# Patient Record
Sex: Male | Born: 1970 | Race: White | Hispanic: No | Smoking: Current every day smoker
Health system: Southern US, Community
[De-identification: ages and names within clinical notes are randomized; demographics above are authoritative.]

## PROBLEM LIST (undated history)

## (undated) DIAGNOSIS — Q379 Unspecified cleft palate with unilateral cleft lip: Secondary | ICD-10-CM

## (undated) DIAGNOSIS — S8991XA Unspecified injury of right lower leg, initial encounter: Secondary | ICD-10-CM

## (undated) DIAGNOSIS — I10 Essential (primary) hypertension: Secondary | ICD-10-CM

## (undated) DIAGNOSIS — F319 Bipolar disorder, unspecified: Secondary | ICD-10-CM

## (undated) DIAGNOSIS — B2790 Infectious mononucleosis, unspecified without complication: Secondary | ICD-10-CM

## (undated) HISTORY — DX: Infectious mononucleosis, unspecified without complication: B27.90

## (undated) HISTORY — DX: Bipolar disorder, unspecified: F31.9

## (undated) HISTORY — DX: Unspecified cleft palate with unilateral cleft lip: Q37.9

## (undated) HISTORY — DX: Unspecified injury of right lower leg, initial encounter: S89.91XA

## (undated) HISTORY — DX: Essential (primary) hypertension: I10

## (undated) HISTORY — PX: CLEFT PALATE REPAIR: SUR1165

---

## 2000-12-19 ENCOUNTER — Emergency Department (HOSPITAL_COMMUNITY): Admission: EM | Admit: 2000-12-19 | Discharge: 2000-12-20 | Payer: Self-pay | Admitting: Emergency Medicine

## 2001-12-26 ENCOUNTER — Emergency Department (HOSPITAL_COMMUNITY): Admission: EM | Admit: 2001-12-26 | Discharge: 2001-12-26 | Payer: Self-pay | Admitting: Emergency Medicine

## 2004-06-09 ENCOUNTER — Emergency Department: Payer: Self-pay | Admitting: Emergency Medicine

## 2004-12-17 ENCOUNTER — Emergency Department: Payer: Self-pay | Admitting: Unknown Physician Specialty

## 2005-07-28 ENCOUNTER — Emergency Department: Payer: Self-pay | Admitting: Emergency Medicine

## 2006-12-15 ENCOUNTER — Other Ambulatory Visit: Payer: Self-pay

## 2006-12-15 ENCOUNTER — Emergency Department: Payer: Self-pay | Admitting: Emergency Medicine

## 2011-05-19 ENCOUNTER — Emergency Department: Payer: Self-pay | Admitting: Emergency Medicine

## 2014-05-14 ENCOUNTER — Ambulatory Visit
Admit: 2014-05-14 | Disposition: A | Discharge status: Home / Self Care | Payer: Self-pay | Attending: Family Medicine | Admitting: Family Medicine

## 2014-05-14 LAB — RAPID STREP-A WITH REFLX: Micro Text Report: NEGATIVE

## 2014-05-14 LAB — RAPID INFLUENZA A&B ANTIGENS (ARMC ONLY)

## 2014-05-19 LAB — BETA STREP CULTURE(ARMC)

## 2016-12-02 ENCOUNTER — Other Ambulatory Visit: Payer: Self-pay

## 2016-12-06 ENCOUNTER — Ambulatory Visit: Payer: Self-pay | Admitting: Family Medicine

## 2017-01-06 ENCOUNTER — Ambulatory Visit: Payer: Self-pay | Admitting: Family Medicine

## 2017-02-15 ENCOUNTER — Encounter: Payer: Self-pay | Admitting: Nurse Practitioner

## 2017-02-15 ENCOUNTER — Ambulatory Visit: Payer: Self-pay | Admitting: Nurse Practitioner

## 2017-02-15 ENCOUNTER — Other Ambulatory Visit: Payer: Self-pay

## 2017-02-15 VITALS — BP 156/101 | HR 89 | Temp 98.4°F | Ht 65.0 in | Wt 284.8 lb

## 2017-02-15 DIAGNOSIS — I10 Essential (primary) hypertension: Secondary | ICD-10-CM | POA: Insufficient documentation

## 2017-02-15 DIAGNOSIS — M7989 Other specified soft tissue disorders: Secondary | ICD-10-CM

## 2017-02-15 DIAGNOSIS — L03115 Cellulitis of right lower limb: Secondary | ICD-10-CM

## 2017-02-15 DIAGNOSIS — F17210 Nicotine dependence, cigarettes, uncomplicated: Secondary | ICD-10-CM

## 2017-02-15 DIAGNOSIS — E785 Hyperlipidemia, unspecified: Secondary | ICD-10-CM

## 2017-02-15 DIAGNOSIS — Z7689 Persons encountering health services in other specified circumstances: Secondary | ICD-10-CM

## 2017-02-15 MED ORDER — SULFAMETHOXAZOLE-TRIMETHOPRIM 800-160 MG PO TABS: 1.0000 | ORAL_TABLET | Freq: Two times a day (BID) | ORAL | 0 refills | Status: DC

## 2017-02-15 MED ORDER — HYDROCHLOROTHIAZIDE 12.5 MG PO TABS: 12.5000 mg | ORAL_TABLET | Freq: Every day | ORAL | 3 refills | Status: DC

## 2017-02-15 MED ORDER — LISINOPRIL 10 MG PO TABS: 10.0000 mg | ORAL_TABLET | Freq: Every day | ORAL | 0 refills | Status: DC

## 2017-02-15 NOTE — Progress Notes (Signed)
Subjective:    Patient ID: Scott Campos, male    DOB: [birth year], 47 y.o.   MRN: [id number]  Scott Campos is a 47 y.o. male presenting on 02/15/2017 for Establish Care (swelling, erythema Rt foot x 1 mth. History of planter fascia tear x . Pt reports constant standing on his feet.) and Hypertension   HPI Establish Care New Provider Pt last seen by PCP Dr. Loma Sender in [place] about 5 years ago.   R foot swelling Pt today reports no pain, but does have mild tenderness.   - [place] plantar fasciitis.  Used foot brace and wore this for several months.  Thought this was better, but has re-injured it about 1 month after improvement.  Wore brace for additional month and now has significant foot swelling without pain.  Has not continued followup at that office.  Discussed possibility of stents in foot. [place] area is where he received care. - Purchased compression copper fit sock at [employer]. - Took ibuprofen 1 pill qid and has continued. - Now feels only pressure from swelling, but not significanly bothersome/sore. - Denies claudication.  Has previously had negative workup of Vascular US at [facility], possible angiogram. No Care everywhere records available today.  Leg swelling is worse today than when at [facility].  Hypertension Took lisinopril 20 mg in past, but has not continued this in several years.  Admits he is a bit nervous in medical facilities.   - He is occasionally checking BP at home or outside of clinic.  Readings with DBP usually in 90s - Current medications: none, but previously had no side effects of medicine - He is not currently symptomatic. - Pt denies headache, lightheadedness, dizziness, changes in vision, chest tightness/pressure, palpitations, leg swelling, sudden loss of speech or loss of consciousness. - He  reports no regular exercise routine.  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 - His diet is high in salt, high in fat, and high  in carbohydrates.  Breakfast: coffee, sausage or butter biscuit (restaurant) or banana and little debbie snack cake, BLT Lunch: Pimiento cheese sandwich, turkey/cheese sandwich, peach cups or tangerines, Diet coke or Unsweet  (sweetnlow) Dinner: Meals out about 2x per week, Pasta frequently, vegetables occasionally (drains and rinses canned veg) Snacks: few, but is taco flavor doritos (handful) - Pt reports prior to last vascular studies, he was eating lots of fast food and fried foods.  Has made some changes already.  Was asked to start cholesterol medication, but didn't desire to start at that time.  Was also told to change diet, so he started that first.  Smoking since about age 63.  Current desire to quit smoking.  Has had about 2-3 years at 1.5 ppd.   Past Medical History:  Diagnosis Date  . Bipolar 1 disorder (HCC)   . Cleft palate and lip   . Malachi Carl infection   . Hypertension   . Right knee injury    Past Surgical History:  Procedure Laterality Date  . CLEFT PALATE REPAIR     Social History   Socioeconomic History  . Marital status: Divorced    Spouse name: Not on file  . Number of children: 0  . Years of education: Not on file  . Highest education level: Associate degree: occupational, Scientist, product/process development, or vocational program  Social Needs  . Financial resource strain: Not on file  . Food insecurity - worry: Not on file  . Food insecurity - inability: Not on  file  . Transportation needs - medical: Yes  . Transportation needs - non-medical: No  Occupational History  . Occupation: Personnel officerelectrician  Tobacco Use  . Smoking status: Current Every Day Smoker    Packs/day: 0.50    Years: 15.00    Pack years: 7.50    Types: Cigarettes  . Smokeless tobacco: Never Used  Substance and Sexual Activity  . Alcohol use: No    Frequency: Never  . Drug use: No  . Sexual activity: Not Currently  Other Topics Concern  . Not on file  Social History Narrative  . Not on file    Family History  Problem Relation Age of Onset  . Leukemia Mother   . Diabetes Father   . Hypertension Father    Current Outpatient Medications on File Prior to Visit  Medication Sig  . ibuprofen (ADVIL,MOTRIN) 200 MG tablet Take 200 mg by mouth every 6 (six) hours as needed.  . Multiple Vitamin (MULTI VITAMIN DAILY PO) Take by mouth.   No current facility-administered medications on file prior to visit.     Review of Systems  Constitutional: Negative.   HENT: Negative.   Eyes: Negative.   Respiratory: Negative.   Cardiovascular: Positive for leg swelling. Negative for chest pain and palpitations.  Gastrointestinal: Negative.   Endocrine: Negative.   Genitourinary: Negative.   Musculoskeletal: Positive for arthralgias (knees).  Skin: Positive for color change (red skin on R leg).  Allergic/Immunologic: Negative.   Neurological: Negative.   Hematological: Negative.   Psychiatric/Behavioral: Negative.    Per HPI unless specifically indicated above      Objective:    BP (!) 156/101 (BP Location: Right Arm, Patient Position: Sitting, Cuff Size: Large)   Pulse 89   Temp 98.4 F (36.9 C) (Oral)   Ht 5\' 5"  (1.651 m)   Wt 284 lb 12.8 oz (129.2 kg)   BMI 47.39 kg/m   Wt Readings from Last 3 Encounters:  02/15/17 284 lb 12.8 oz (129.2 kg)    Physical Exam  General - morbidly obese, well-appearing, NAD HEENT - Normocephalic, atraumatic Neck - supple, non-tender, no LAD, no thyromegaly, no carotid bruit Heart - RRR, no murmurs heard Lungs - Clear throughout all lobes, no wheezing, crackles, or rhonchi. Normal work of breathing. Extremeties - non-tender, +4 edema RLE/+3 edema LLE, cap refill < 2 seconds RLE/ =3 sec LLE, peripheral pulses not palpable/no doppler available Skin - warm, dry except feet. R foot warmer to touch than rest of his body, L foot cool to touch, no drainage/weeping, but are sweaty Neuro - awake, alert, oriented x3, normal gait Psych - Normal mood  and affect, normal behavior      Assessment & Plan:   Problem List Items Addressed This Visit      Cardiovascular and Mediastinum   Essential hypertension - Primary    Unontrolled hypertension.  BP at goal.  Pt is working on lifestyle modifications.  He is not currently taking medications, but previously took them over 5 years ago and tolerated lisinopril well without side effects. Vascular complications likely.  Plan: 1. START taking lisinopril 10 mg once daily.  START hydrochlorothiazide 12.5 mg once daily.  Choice to start two agents r/t fluid retention. 2. Obtain labs BMP, Lipid today.  CMP at next appointment. 3. Encouraged heart healthy diet and increasing exercise to 30 minutes most days of the week. 4. Check BP 1-2 x per week at home, keep log, and bring to clinic at next appointment. 5. Follow  up 4 weeks.       Relevant Medications   lisinopril (PRINIVIL,ZESTRIL) 10 MG tablet   hydrochlorothiazide (HYDRODIURIL) 12.5 MG tablet   Other Relevant Orders   BASIC METABOLIC PANEL WITH GFR    Other Visit Diagnoses    Swelling of lower leg     Bilateral lower extremity edema is new symptoms since last vascular studies.  Likely venous stasis/reflux.  Without records from [facility], unable to tell if venous studies were performed.  Pt description is indicative of arterial studies with angiogram.  Will obtain records.  Cap refill sufficient RLE, decreased slightly on LLE.  No other evidence of arterial insufficiency.  No skin breakdown.  Plan: 1. Start HCTZ 12.5 mg for BP and edema management. 2. Encouraged weight loss for improvement of symptoms. 3. Increase physical activity to 30 minutes most days of the week. 4. Start wearing compression stockings OTC from [facility] as previously purchased.  Can increase pressure if tolerated and effective w/o arterial insufficiency. 5. Followup 4 weeks.   Relevant Medications   hydrochlorothiazide (HYDRODIURIL) 12.5 MG tablet   Other Relevant Orders    VAS [place] UPPER EXTREMITY VENOUS DUPLEX   VAS [place] ABI WITH/WO TBI   Cellulitis of right lower extremity     Erythematous, warm skin of R foot c/w cellulitis outside of foot pain or known injury.  Complicated by obesity, probable venous stasis.  No current weeping noted.  Plan: 1. START Bactrim 800-160mg  bid x 14 days. 2. Encouraged pt to wear compression socks. 3. Followup 2-4 weeks prn.   Relevant Medications   sulfamethoxazole-trimethoprim (BACTRIM DS,SEPTRA DS) 800-160 MG tablet   Encounter to establish care Previous PCP was > 5 years ago.  Recent care by specialists at [facility].  Records will be requested.  Past medical, family, and surgical history reviewed w/ pt.  Smoking Cessation Pt reports has cut back smoking from 1.5 ppd last year after his father's death to 0.5 ppd now.  He would like to completely quit.  Is still able to cut back, so encouraged to work on further reducing cigarettes.  Offered future pharmacotherapy if unsuccessful independently.   Discussion today < 5 minutes specifically on counseling on risks of tobacco use, complications, treatment, smoking cessation.        Hyperlipidemia, unspecified hyperlipidemia type     Per pt report, was elevated at last check at [facility]. Pt has made lifestyle changes, so will recheck lipid panel and reassess need for statin therapy.   Relevant Medications   lisinopril (PRINIVIL,ZESTRIL) 10 MG tablet   hydrochlorothiazide (HYDRODIURIL) 12.5 MG tablet   Other Relevant Orders   Lipid panel      Meds ordered this encounter  Medications  . lisinopril (PRINIVIL,ZESTRIL) 10 MG tablet    Sig: Take 1 tablet (10 mg total) by mouth daily.    Dispense:  30 tablet    Refill:  0    Order Specific Question:   Supervising Provider    Answer:   Smitty Cords [[id number]]  . hydrochlorothiazide (HYDRODIURIL) 12.5 MG tablet    Sig: Take 1 tablet (12.5 mg total) by mouth daily.    Dispense:  90 tablet    Refill:  3    Order Specific Question:    Supervising Provider    Answer:   Smitty Cords [[id number]]  . sulfamethoxazole-trimethoprim (BACTRIM DS,SEPTRA DS) 800-160 MG tablet    Sig: Take 1 tablet by mouth 2 (two) times daily.    Dispense:  28 tablet  Refill:  0    Order Specific Question:   Supervising Provider    Answer:   Smitty Cords [[id number]]    Follow up plan: Return in about 4 weeks (around 03/15/2017) for Hypertension.  [name], DNP, AGPCNP-BC Adult Gerontology Primary Care Nurse Practitioner [facility] 02/15/2017, 11:05 AM

## 2017-02-15 NOTE — Assessment & Plan Note (Signed)
Unontrolled hypertension.  BP at goal.  Pt is working on lifestyle modifications.  He is not currently taking medications, but previously took them over 5 years ago and tolerated lisinopril well without side effects. Vascular complications likely.  Plan: 1. START taking lisinopril 10 mg once daily.  START hydrochlorothiazide 12.5 mg once daily.  Choice to start two agents r/t fluid retention. 2. Obtain labs BMP, Lipid today.  CMP at next appointment. 3. Encouraged heart healthy diet and increasing exercise to 30 minutes most days of the week. 4. Check BP 1-2 x per week at home, keep log, and bring to clinic at next appointment. 5. Follow up 4 weeks.

## 2017-02-15 NOTE — Patient Instructions (Addendum)
Scott Campos, Thank you for coming in to clinic today.  1. Restart BP medications.  - Take hydrochlorothiazide 12.5 mg once daily and lisinopril 10 mg once daily  2. We are getting a repeat ultrasound of your legs.  They will call to schedule.   Please schedule a follow-up appointment with Scott McardleLauren Cas Campos, AGNP. Return in about 4 weeks (around 03/15/2017) for Hypertension.  If you have any other questions or concerns, please feel free to call the clinic or send a message through MyChart. You may also schedule an earlier appointment if necessary.  You will receive a survey after today's visit either digitally by e-mail or paper by [employer] mail. Your experiences and feedback matter to us.  Please respond so we know how we are doing as we provide care for you.   Scott McardleLauren Kelton Bultman, DNP, AGNP-BC Adult Gerontology Nurse Practitioner [facility], [facility]    Managing Your Hypertension Hypertension is commonly called high blood pressure. This is when the force of your blood pressing against the walls of your arteries is too strong. Arteries are blood vessels that carry blood from your heart throughout your body. Hypertension forces the heart to work harder to pump blood, and may cause the arteries to become narrow or stiff. Having untreated or uncontrolled hypertension can cause heart attack, stroke, kidney disease, and other problems. What are blood pressure readings? A blood pressure reading consists of a higher number over a lower number. Ideally, your blood pressure should be below 120/80. The first ("top") number is called the systolic pressure. It is a measure of the pressure in your arteries as your heart beats. The second ("bottom") number is called the diastolic pressure. It is a measure of the pressure in your arteries as the heart relaxes. What does my blood pressure reading mean? Blood pressure is classified into four stages. Based on your blood pressure reading, your health care provider  may use the following stages to determine what type of treatment you need, if any. Systolic pressure and diastolic pressure are measured in a unit called mm Hg. Normal  Systolic pressure: below 120.  Diastolic pressure: below 80. Elevated  Systolic pressure: 120-129.  Diastolic pressure: below 80. Hypertension stage 1  Systolic pressure: 130-139.  Diastolic pressure: 80-89. Hypertension stage 2  Systolic pressure: 140 or above.  Diastolic pressure: 90 or above. What health risks are associated with hypertension? Managing your hypertension is an important responsibility. Uncontrolled hypertension can lead to:  A heart attack.  A stroke.  A weakened blood vessel (aneurysm).  Heart failure.  Kidney damage.  Eye damage.  Metabolic syndrome.  Memory and concentration problems.  What changes can I make to manage my hypertension? Hypertension can be managed by making lifestyle changes and possibly by taking medicines. Your health care provider will help you make a plan to bring your blood pressure within a normal range. Eating and drinking  Eat a diet that is high in fiber and potassium, and low in salt (sodium), added sugar, and fat. An example eating plan is called the DASH (Dietary Approaches to Stop Hypertension) diet. To eat this way: ? Eat plenty of fresh fruits and vegetables. Try to fill half of your plate at each meal with fruits and vegetables. ? Eat whole grains, such as whole wheat pasta, brown rice, or whole grain bread. Fill about one quarter of your plate with whole grains. ? Eat low-fat diary products. ? Avoid fatty cuts of meat, processed or cured meats, and poultry with  skin. Fill about one quarter of your plate with lean proteins such as fish, chicken without skin, beans, eggs, and tofu. ? Avoid premade and processed foods. These tend to be higher in sodium, added sugar, and fat.  Reduce your daily sodium intake. Most people with hypertension should eat  less than 1,500 mg of sodium a day.  Limit alcohol intake to no more than 1 drink a day for nonpregnant women and 2 drinks a day for men. One drink equals 12 oz of beer, 5 oz of wine, or 1 oz of hard liquor. Lifestyle  Work with your health care provider to maintain a healthy body weight, or to lose weight. Ask what an ideal weight is for you.  Get at least 30 minutes of exercise that causes your heart to beat faster (aerobic exercise) most days of the week. Activities may include walking, swimming, or biking.  Include exercise to strengthen your muscles (resistance exercise), such as weight lifting, as part of your weekly exercise routine. Try to do these types of exercises for 30 minutes at least 3 days a week.  Do not use any products that contain nicotine or tobacco, such as cigarettes and e-cigarettes. If you need help quitting, ask your health care provider.  Control any long-term (chronic) conditions you have, such as high cholesterol or diabetes. Monitoring  Monitor your blood pressure at home as told by your health care provider. Your personal target blood pressure may vary depending on your medical conditions, your age, and other factors.  Have your blood pressure checked regularly, as often as told by your health care provider. Working with your health care provider  Review all the medicines you take with your health care provider because there may be side effects or interactions.  Talk with your health care provider about your diet, exercise habits, and other lifestyle factors that may be contributing to hypertension.  Visit your health care provider regularly. Your health care provider can help you create and adjust your plan for managing hypertension. Will I need medicine to control my blood pressure? Your health care provider may prescribe medicine if lifestyle changes are not enough to get your blood pressure under control, and if:  Your systolic blood pressure is 130 or  higher.  Your diastolic blood pressure is 80 or higher.  Take medicines only as told by your health care provider. Follow the directions carefully. Blood pressure medicines must be taken as prescribed. The medicine does not work as well when you skip doses. Skipping doses also puts you at risk for problems. Contact a health care provider if:  You think you are having a reaction to medicines you have taken.  You have repeated (recurrent) headaches.  You feel dizzy.  You have swelling in your ankles.  You have trouble with your vision. Get help right away if:  You develop a severe headache or confusion.  You have unusual weakness or numbness, or you feel faint.  You have severe pain in your chest or abdomen.  You vomit repeatedly.  You have trouble breathing. Summary  Hypertension is when the force of blood pumping through your arteries is too strong. If this condition is not controlled, it may put you at risk for serious complications.  Your personal target blood pressure may vary depending on your medical conditions, your age, and other factors. For most people, a normal blood pressure is less than 120/80.  Hypertension is managed by lifestyle changes, medicines, or both. Lifestyle changes include weight  loss, eating a healthy, low-sodium diet, exercising more, and limiting alcohol. This information is not intended to replace advice given to you by your health care provider. Make sure you discuss any questions you have with your health care provider. Document Released: 09/29/2011 Document Revised: 12/03/2015 Document Reviewed: 12/03/2015 Elsevier Interactive Patient Education  [employer].

## 2017-03-18 ENCOUNTER — Ambulatory Visit: Payer: Self-pay | Admitting: Nurse Practitioner

## 2017-03-18 ENCOUNTER — Encounter: Payer: Self-pay | Admitting: Nurse Practitioner

## 2017-03-18 ENCOUNTER — Other Ambulatory Visit: Payer: Self-pay

## 2017-03-18 DIAGNOSIS — I10 Essential (primary) hypertension: Secondary | ICD-10-CM

## 2017-03-18 MED ORDER — LISINOPRIL 20 MG PO TABS
20.0000 mg | ORAL_TABLET | Freq: Every day | ORAL | 5 refills | Status: DC
Start: 2017-03-18 — End: 2017-10-09

## 2017-03-18 NOTE — Progress Notes (Signed)
Subjective:    Patient ID: Scott Campos, male    DOB: Jan 14, 1971, 47 y.o.   MRN: [id number]  Scott Campos is a 47 y.o. male presenting on 03/18/2017 for Hypertension   HPI Hypertension - He is not checking BP at home or outside of clinic.    - Current medications: hydrochlorothiazide, tolerating well without side effects - He is not currently symptomatic. - Pt denies headache, lightheadedness, dizziness, changes in vision, chest tightness/pressure, palpitations, leg swelling, sudden loss of speech or loss of consciousness. - He  reports an exercise routine that includes building projects and activities at home,   days per week. - His diet is moderate in salt, moderate in fat, and moderate in carbohydrates.   Increasing vegetables (broccoli, corn, blackeye peas, green beans)  Has had significant improvement of leg swelling with compression socks.  Social History   Tobacco Use  . Smoking status: Current Every Day Smoker    Packs/day: 0.50    Years: 15.00    Pack years: 7.50    Types: Cigarettes  . Smokeless tobacco: Never Used  Substance Use Topics  . Alcohol use: No    Frequency: Never  . Drug use: No    Review of Systems Per HPI unless specifically indicated above     Objective:    BP (!) 134/96 (BP Location: Right Arm, Patient Position: Sitting, Cuff Size: Large)   Pulse (!) 102   Temp 98.3 F (36.8 C) (Oral)   Ht 5\' 5"  (1.651 m)   Wt 272 lb 6.4 oz (123.6 kg)   BMI 45.33 kg/m   Wt Readings from Last 3 Encounters:  04/05/17 272 lb 9.6 oz (123.7 kg)  03/18/17 272 lb 6.4 oz (123.6 kg)  02/15/17 284 lb 12.8 oz (129.2 kg)    Physical Exam  Constitutional: He is oriented to person, place, and time. He appears well-developed and well-nourished. No distress.  Neck: Normal range of motion. Neck supple. Carotid bruit is not present.  Cardiovascular: Normal rate, regular rhythm, S1 normal, S2 normal, normal heart sounds and intact distal pulses.    Pulmonary/Chest: Effort normal and breath sounds normal. No respiratory distress.  Musculoskeletal: He exhibits no edema (pedal).  Neurological: He is alert and oriented to person, place, and time.  Skin: Skin is warm and dry.  Psychiatric: He has a normal mood and affect. His behavior is normal.  Vitals reviewed.    Results for orders placed or performed in visit on 02/15/17  Lipid panel  Result Value Ref Range   Cholesterol 166 <200 mg/dL   HDL 45 >78>40 mg/dL   Triglycerides 295135 <621<150 mg/dL   LDL Cholesterol (Calc) 98 mg/dL (calc)   Total CHOL/HDL Ratio 3.7 <5.0 (calc)   Non-HDL Cholesterol (Calc) 121 <130 mg/dL (calc)  BASIC METABOLIC PANEL WITH GFR  Result Value Ref Range   Glucose, Bld 115 (H) 65 - 99 mg/dL   BUN 6 (L) 7 - 25 mg/dL   Creat 3.080.88 6.570.60 - 8.461.35 mg/dL   GFR, Est Non African American 102 > OR = 60 mL/min/1.7573m2   GFR, Est African American 118 > OR = 60 mL/min/1.3273m2   BUN/Creatinine Ratio 7 6 - 22 (calc)   Sodium 135 135 - 146 mmol/L   Potassium 4.1 3.5 - 5.3 mmol/L   Chloride 96 (L) 98 - 110 mmol/L   CO2 30 20 - 32 mmol/L   Calcium 10.3 8.6 - 10.3 mg/dL      Assessment & Plan:  Problem List Items Addressed This Visit      Cardiovascular and Mediastinum   Essential hypertension   Relevant Medications   lisinopril (PRINIVIL,ZESTRIL) 20 MG tablet    Persistently uncontrolled hypertension, but is slightly improved.  BP goal < 130/80.  Pt is working on lifestyle modifications.  Taking medications tolerating well without side effects. No currently identified complications.  Plan: 1. Continue taking hydrochlorothiazide.  Increase lisinopril to 20 mg once daily.  Can consider increase of HCTZ to 25 in future if needed. 2. Encouraged heart healthy diet and increasing exercise to 30 minutes most days of the week. 3. Check BP 1-2 x per week at home, keep log, and bring to clinic at next appointment. 4. Follow up 3 months.     Meds ordered this encounter   Medications  . lisinopril (PRINIVIL,ZESTRIL) 20 MG tablet    Sig: Take 1 tablet (20 mg total) by mouth daily.    Dispense:  30 tablet    Refill:  5    Order Specific Question:   Supervising Provider    Answer:   Smitty Cords [[id number]]    Follow up plan: Return in about 3 months (around 06/18/2017) for hypertension.   [name], DNP, AGPCNP-BC Adult Gerontology Primary Care Nurse Practitioner [facility] 04/12/2017, 4:44 PM

## 2017-03-18 NOTE — Patient Instructions (Addendum)
Scott Campos, Thank you for coming in to clinic today.  1. [facility] [address]  [place], [place] [address]  [phone]  [facility] [address] [place], [place] [address] [phone]  2. Increase lisinopril to 20 mg once daily  3. Continue hydrochlorothiazide 12.5 mg once daily.  Please schedule a follow-up appointment with Scott Campos, Scott Campos. Return in about 3 months (around 06/18/2017) for hypertension.  If you have any other questions or concerns, please feel free to call the clinic or send a message through MyChart. You may also schedule an earlier appointment if necessary.  You will receive a survey after today's visit either digitally by e-mail or paper by [employer] mail. Your experiences and feedback matter to us.  Please respond so we know how we are doing as we provide care for you.   Scott McardleLauren Anna Beaird, Scott Campos, Scott Campos Adult Gerontology Nurse Practitioner [facility] [place] [facility], [facility]

## 2017-03-19 LAB — BASIC METABOLIC PANEL WITH GFR
BUN/Creatinine Ratio: 7 (calc) (ref 6–22)
BUN: 6 mg/dL — ABNORMAL LOW (ref 7–25)
CO2: 30 mmol/L (ref 20–32)
Calcium: 10.3 mg/dL (ref 8.6–10.3)
Chloride: 96 mmol/L — ABNORMAL LOW (ref 98–110)
Creat: 0.88 mg/dL (ref 0.60–1.35)
GFR, Est African American: 118 mL/min/{1.73_m2} (ref >=60)
GFR, Est Non African American: 102 mL/min/{1.73_m2} (ref >=60)
Glucose, Bld: 115 mg/dL — ABNORMAL HIGH (ref 65–99)
Potassium: 4.1 mmol/L (ref 3.5–5.3)
Sodium: 135 mmol/L (ref 135–146)

## 2017-03-19 LAB — LIPID PANEL
Cholesterol: 166 mg/dL (ref <200)
HDL: 45 mg/dL (ref >40)
LDL Cholesterol (Calc): 98 mg/dL (calc)
Non-HDL Cholesterol (Calc): 121 mg/dL (calc) (ref <130)
Total CHOL/HDL Ratio: 3.7 (calc) (ref <5.0)
Triglycerides: 135 mg/dL (ref <150)

## 2017-03-21 ENCOUNTER — Telehealth: Payer: Self-pay

## 2017-03-21 NOTE — Telephone Encounter (Signed)
Attempted to contacted the patient, no answer.

## 2017-03-21 NOTE — Telephone Encounter (Signed)
-----   Message from Lauren Renee Kennedy, NP sent at 03/21/2017  8:13 AM EST ----- Lipid panel: normal values.  No changes necessary.  Continue working toward eating a healthy diet and increasing exercise.  Ideally, your HDL or good cholesterol should be higher than 50.  BMP: normal kidney function, liver function, and electrolytes.  Glucose is slightly elevated for fasting glucose.  I would recommend we check a hemoglobin A1c next time you are in clinic.  This will help us know if you are at risk of developing diabetes or if you have diabetes.  A single elevation in fasting glucose can occur without these conditions, so that test will be helpful for confirming. 

## 2017-03-21 NOTE — Telephone Encounter (Signed)
Attempted to contact the pt, the home phone number busy and cell phone unable to take incoming call . Will try again.

## 2017-03-22 NOTE — Telephone Encounter (Signed)
Attempted to contact the pt, no answer. LMOm to return my call.  

## 2017-03-22 NOTE — Telephone Encounter (Signed)
-----   Message from Lauren Renee Kennedy, NP sent at 03/21/2017  8:13 AM EST ----- Lipid panel: normal values.  No changes necessary.  Continue working toward eating a healthy diet and increasing exercise.  Ideally, your HDL or good cholesterol should be higher than 50.  BMP: normal kidney function, liver function, and electrolytes.  Glucose is slightly elevated for fasting glucose.  I would recommend we check a hemoglobin A1c next time you are in clinic.  This will help us know if you are at risk of developing diabetes or if you have diabetes.  A single elevation in fasting glucose can occur without these conditions, so that test will be helpful for confirming. 

## 2017-03-22 NOTE — Telephone Encounter (Signed)
The pt was notified of his lab results, no questions or concerns.

## 2017-03-22 NOTE — Telephone Encounter (Signed)
-----   Message from Galen ManilaLauren Renee Kennedy, NP sent at 03/21/2017  8:13 AM EST ----- Lipid panel: normal values.  No changes necessary.  Continue working toward eating a healthy diet and increasing exercise.  Ideally, your HDL or good cholesterol should be higher than 50.  BMP: normal kidney function, liver function, and electrolytes.  Glucose is slightly elevated for fasting glucose.  I would recommend we check a hemoglobin A1c next time you are in clinic.  This will help us know if you are at risk of developing diabetes or if you have diabetes.  A single elevation in fasting glucose can occur without these conditions, so that test will be helpful for confirming.

## 2017-04-04 ENCOUNTER — Telehealth: Payer: Self-pay | Admitting: Nurse Practitioner

## 2017-04-04 ENCOUNTER — Ambulatory Visit: Payer: Self-pay | Admitting: Nurse Practitioner

## 2017-04-04 NOTE — Telephone Encounter (Signed)
Pt has cough, headache and fever

## 2017-04-05 ENCOUNTER — Ambulatory Visit
Admission: RE | Admit: 2017-04-05 | Discharge: 2017-04-05 | Disposition: A | Discharge status: Home / Self Care | Payer: Self-pay | Source: Ambulatory Visit | Attending: Nurse Practitioner | Admitting: Nurse Practitioner

## 2017-04-05 ENCOUNTER — Ambulatory Visit: Payer: Self-pay | Admitting: Nurse Practitioner

## 2017-04-05 ENCOUNTER — Encounter: Payer: Self-pay | Admitting: Nurse Practitioner

## 2017-04-05 ENCOUNTER — Other Ambulatory Visit: Payer: Self-pay

## 2017-04-05 VITALS — BP 109/83 | HR 80 | Temp 98.0°F | Resp 20 | Ht 65.0 in | Wt 272.6 lb

## 2017-04-05 DIAGNOSIS — R509 Fever, unspecified: Secondary | ICD-10-CM | POA: Insufficient documentation

## 2017-04-05 DIAGNOSIS — J22 Unspecified acute lower respiratory infection: Secondary | ICD-10-CM | POA: Insufficient documentation

## 2017-04-05 DIAGNOSIS — R05 Cough: Secondary | ICD-10-CM | POA: Insufficient documentation

## 2017-04-05 DIAGNOSIS — R6889 Other general symptoms and signs: Secondary | ICD-10-CM

## 2017-04-05 DIAGNOSIS — R059 Cough, unspecified: Secondary | ICD-10-CM

## 2017-04-05 DIAGNOSIS — J3489 Other specified disorders of nose and nasal sinuses: Secondary | ICD-10-CM

## 2017-04-05 DIAGNOSIS — R062 Wheezing: Secondary | ICD-10-CM | POA: Insufficient documentation

## 2017-04-05 LAB — POCT INFLUENZA A/B
Influenza A, POC: NEGATIVE
Influenza B, POC: NEGATIVE

## 2017-04-05 MED ORDER — IPRATROPIUM-ALBUTEROL 0.5-2.5 (3) MG/3ML IN SOLN: 3.0000 mL | Freq: Four times a day (QID) | RESPIRATORY_TRACT | Status: AC

## 2017-04-05 MED ORDER — BENZONATATE 100 MG PO CAPS: 100.0000 mg | ORAL_CAPSULE | Freq: Three times a day (TID) | ORAL | 0 refills | Status: DC | PRN

## 2017-04-05 MED ORDER — ALBUTEROL SULFATE HFA 108 (90 BASE) MCG/ACT IN AERS: 1.0000 | INHALATION_SPRAY | Freq: Four times a day (QID) | RESPIRATORY_TRACT | 5 refills | Status: DC | PRN

## 2017-04-05 MED ORDER — IPRATROPIUM BROMIDE 0.06 % NA SOLN: 2.0000 | Freq: Four times a day (QID) | NASAL | 0 refills | Status: DC

## 2017-04-05 NOTE — Patient Instructions (Addendum)
Scott Campos, Thank you for coming in to clinic today.  1.  Your flu test was inclusive - it did not complete correctly.  It is possible you can still have the flu, otherwise it could be a different virus causing your symptoms.  - Wash hands and cover cough very well to avoid spread of infection - For symptom control:      - Take Ibuprofen / Advil 400-600mg  every 6-8 hours as needed for fever / muscle aches.  You may also take Tylenol 500-1000mg  per dose every 6-8 hours or 3 times a day.  You can alternate dosing and take both in the same day.      - Do not take more than 3,000 mg acetaminophen (Tylenol) in a day.      - Start Tessalon perles one every 8 hours or 3 times a day as needed for cough.      - Start Atrovent nasal spray decongestant 2 sprays in each nostril up to 4 times daily for 7 days.      - Start OTC Mucinex for congestion/ Mucinex-DM for cough and congestion for up to 7 days. - Improve hydration with plenty of clear fluids.  Drink up to 8 glasses of water / fluids each day.  - You also have wheezing from bronchitis or pneumonia.  We can confirm once your Xray results have returned.  START albuterol 1-2 puffs every 6 hours as needed for wheezing and shortness of breath.  I will let you know if you will need antibiotics.  If significant worsening with poor fluid intake, worsening fever, difficulty breathing due to coughing, worsening body aches, weakness, or other more concerning symptoms difficulty breathing you can seek treatment at Emergency Department. If your flu symptoms have improved and then get worse several days to a week later with concerns for bronchitis, productive cough, fever, and chills we may need to check for possible pneumonia that can occur after the flu.  Please schedule a follow-up appointment with Scott Campos, AGNP. Return 7-10 days if symptoms worsen or fail to improve.  If you have any other questions or concerns, please feel free to call the clinic or send  a message through MyChart. You may also schedule an earlier appointment if necessary.  You will receive a survey after today's visit either digitally by e-mail or paper by [employer] mail. Your experiences and feedback matter to us.  Please respond so we know how we are doing as we provide care for you.   Scott McardleLauren Genese Quebedeaux, DNP, AGNP-BC Adult Gerontology Nurse Practitioner [facility] [place] [facility], [facility]

## 2017-04-05 NOTE — Progress Notes (Signed)
Subjective:    Patient ID: Scott Campos, male    DOB: April 17, 1970, 47 y.o.   MRN: [id number]  Scott SaaChristopher R Vivona is a 47 y.o. male presenting on 04/05/2017 for Cough (vomiting x 24hrs on Friday, coughing, bodyaches, chest congestion, wheezing and chills x 4 days )   HPI Flu-like symptoms Pt presents today 4 days after initial symptoms.  Symptoms include bodyaches, cough, chest congestion, wheezing, chills.  He had episodes of vomiting 6-8 x Friday night, which had resolved by Saturday.  Had low-grade fever with chills. (94.7 deg F with home thermometer, 98.7 deg F) - Has had mild improvement through the day yesterday. - Is using Nyquil, afrin spray (2 days).  Continues having body aches, but chest and abdominal discomfort is worsened from coughing. - Pt denies diarrhea and constipation.   Social History   Tobacco Use  . Smoking status: Current Every Day Smoker    Packs/day: 0.50    Years: 15.00    Pack years: 7.50    Types: Cigarettes  . Smokeless tobacco: Never Used  Substance Use Topics  . Alcohol use: No    Frequency: Never  . Drug use: No    Review of Systems Per HPI unless specifically indicated above     Objective:    BP 109/83 (BP Location: Right Arm, Patient Position: Sitting, Cuff Size: Large)   Pulse 80   Temp 98 F (36.7 C) (Oral)   Resp 20   Ht 5\' 5"  (1.651 m)   Wt 272 lb 9.6 oz (123.7 kg)   SpO2 (!) 89%   BMI 45.36 kg/m   Wt Readings from Last 3 Encounters:  04/05/17 272 lb 9.6 oz (123.7 kg)  03/18/17 272 lb 6.4 oz (123.6 kg)  02/15/17 284 lb 12.8 oz (129.2 kg)    Physical Exam  Constitutional: He is oriented to person, place, and time. He appears well-developed and well-nourished. He appears distressed (mildly).  HENT:  Head: Normocephalic and atraumatic.  Right Ear: Hearing, tympanic membrane, external ear and ear canal normal.  Left Ear: Hearing, external ear and ear canal normal. 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  Nose: Mucosal edema and rhinorrhea present. Right sinus exhibits no maxillary sinus tenderness and no frontal sinus tenderness. Left sinus exhibits no maxillary sinus tenderness and no frontal sinus tenderness.  Mouth/Throat: Uvula is midline and mucous membranes are normal. Posterior oropharyngeal edema and posterior oropharyngeal erythema present. Oropharyngeal exudate: clear secretions.  Neck: Normal range of motion. Neck supple.  Cardiovascular: Normal rate, regular rhythm, S1 normal, S2 normal, normal heart sounds and intact distal pulses.  Pulmonary/Chest: Effort normal and breath sounds normal. No respiratory distress.  Lymphadenopathy:    He has cervical adenopathy.  Neurological: He is alert and oriented to person, place, and time.  Skin: Skin is warm and dry.  Psychiatric: He has a normal mood and affect. His behavior is normal.  Vitals reviewed.   Results for orders placed or performed in visit on 02/15/17  Lipid panel  Result Value Ref Range   Cholesterol 166 <200 mg/dL   HDL 45 >40>40 mg/dL   Triglycerides 981135 <191<150 mg/dL   LDL Cholesterol (Calc) 98 mg/dL (calc)   Total CHOL/HDL Ratio 3.7 <5.0 (calc)   Non-HDL Cholesterol (Calc) 121 <130 mg/dL (calc)  BASIC METABOLIC PANEL WITH GFR  Result Value Ref Range   Glucose, Bld 115 (H) 65 - 99 mg/dL   BUN 6 (L) 7 -  25 mg/dL   Creat 1.61 0.96 - 0.45 mg/dL   GFR, Est Non African American 102 > OR = 60 mL/min/1.6m2   GFR, Est African American 118 > OR = 60 mL/min/1.48m2   BUN/Creatinine Ratio 7 6 - 22 (calc)   Sodium 135 135 - 146 mmol/L   Potassium 4.1 3.5 - 5.3 mmol/L   Chloride 96 (L) 98 - 110 mmol/L   CO2 30 20 - 32 mmol/L   Calcium 10.3 8.6 - 10.3 mg/dL      Assessment & Plan:   Problem List Items Addressed This Visit    None    Visit Diagnoses    Flu-like symptoms    -  Primary   Relevant Medications   ipratropium-albuterol (DUONEB) 0.5-2.5 (3) MG/3ML nebulizer solution  3 mL   albuterol (PROVENTIL HFA;VENTOLIN HFA) 108 (90 Base) MCG/ACT inhaler   ipratropium (ATROVENT) 0.06 % nasal spray   Other Relevant Orders   POCT Influenza A/B   Cough       Relevant Medications   benzonatate (TESSALON) 100 MG capsule   Other Relevant Orders   DG Chest 2 View   Lower respiratory infection (e.g., bronchitis, pneumonia, pneumonitis, pulmonitis)       Relevant Medications   albuterol (PROVENTIL HFA;VENTOLIN HFA) 108 (90 Base) MCG/ACT inhaler   benzonatate (TESSALON) 100 MG capsule   Other Relevant Orders   DG Chest 2 View   Rhinorrhea       Relevant Medications   ipratropium (ATROVENT) 0.06 % nasal spray      Clinically diagnosed influenza despite inconclusive rapid flu test today, concern for flu still due to symptom presentation.  - Duration x 4 days, with complication of hypoxia and wheezing. Tolerating PO and well hydrated - No other focal findings of infection today - Did not receive influenza vaccine this season  Plan: 1. Considered antiviral, but pt w/ symptoms x 3-4 days.  2. Supportive care as advised with NSAID / Tylenol PRN fever/myalgias, improve hydration, may take OTC Cold/Flu meds 3. START albuterol 1-2 puffs every 6 hours as needed for wheezing and shortness of breath.  4. DG chest 2 view today for evaluation of pneumonia. 5. Duoneb in clinic today for hypoxia, decreased air movement and wheezing. 6. Return criteria given if significant worsening, consider post-influenza complications, otherwise follow-up if needed   Meds ordered this encounter  Medications  . ipratropium-albuterol (DUONEB) 0.5-2.5 (3) MG/3ML nebulizer solution 3 mL  . albuterol (PROVENTIL HFA;VENTOLIN HFA) 108 (90 Base) MCG/ACT inhaler    Sig: Inhale 1-2 puffs into the lungs every 6 (six) hours as needed for wheezing or shortness of breath.    Dispense:  1 Inhaler    Refill:  5    Product selection permitted for pt/insurance brand preference.    Order Specific Question:    Supervising Provider    Answer:   Smitty Cords [[id number]]  . benzonatate (TESSALON) 100 MG capsule    Sig: Take 1 capsule (100 mg total) by mouth 3 (three) times daily as needed for cough.    Dispense:  30 capsule    Refill:  0    Order Specific Question:   Supervising Provider    Answer:   Smitty Cords [[id number]]  . ipratropium (ATROVENT) 0.06 % nasal spray    Sig: Place 2 sprays into both nostrils 4 (four) times daily.    Dispense:  15 mL    Refill:  0    Order Specific Question:   Supervising  Provider    Answer:   Smitty Cords [[id number]]      Follow up plan: Return 7-10 days if symptoms worsen or fail to improve.  [name], DNP, AGPCNP-BC Adult Gerontology Primary Care Nurse Practitioner [facility] 04/05/2017, 10:45 AM

## 2017-04-12 ENCOUNTER — Encounter: Payer: Self-pay | Admitting: Nurse Practitioner

## 2017-06-21 ENCOUNTER — Ambulatory Visit: Payer: Self-pay | Admitting: Nurse Practitioner

## 2017-07-04 ENCOUNTER — Telehealth: Payer: Self-pay | Admitting: Nurse Practitioner

## 2017-07-04 DIAGNOSIS — B002 Herpesviral gingivostomatitis and pharyngotonsillitis: Secondary | ICD-10-CM

## 2017-07-04 MED ORDER — VALACYCLOVIR HCL 1 G PO TABS: 1000.0000 mg | ORAL_TABLET | Freq: Two times a day (BID) | ORAL | 0 refills | Status: DC

## 2017-07-04 NOTE — Telephone Encounter (Signed)
patient called back within hour wants Rx for Valtrex send.

## 2017-07-04 NOTE — Telephone Encounter (Signed)
Pt has a flare up of herpes on his mouth.  He asked for a new prescription of valtrex.  He uses [facility].  His call back number is [phone]

## 2017-07-04 NOTE — Telephone Encounter (Signed)
patient advised and he understand to schedule appointment for future refill.

## 2017-07-04 NOTE — Telephone Encounter (Signed)
Spoke with Marchelle FolksAmanda at [facility] - confirmed that patient requested new rx Valtrex. They do not have one on file for him.  I agree that given current symptoms it is reasonable to go ahead and start treatment promptly for him.  Phoned new rx info verbal to [facility] today - Valtrex 1g (1000mg ) pills twice daily for up to max 7 days for flare, may stop after 5 days if resolved.  He will need to follow-up in office with PCP Wilhelmina McardleLauren Kennedy, AGPCNP-BC in future for further refills of this medication or if another flare or if this is not resolved. Since this is a new problem to us for him.  Saralyn PilarAlexander Amariyah Bazar, DO [facility] [place] [facility] 07/04/2017, 10:26 AM

## 2017-07-11 ENCOUNTER — Encounter: Payer: Self-pay | Admitting: Nurse Practitioner

## 2017-07-11 ENCOUNTER — Other Ambulatory Visit: Payer: Self-pay

## 2017-07-11 ENCOUNTER — Ambulatory Visit (INDEPENDENT_AMBULATORY_CARE_PROVIDER_SITE_OTHER): Payer: Self-pay | Admitting: Nurse Practitioner

## 2017-07-11 VITALS — BP 129/83 | HR 97 | Temp 98.3°F | Ht 65.0 in | Wt 268.4 lb

## 2017-07-11 DIAGNOSIS — B002 Herpesviral gingivostomatitis and pharyngotonsillitis: Secondary | ICD-10-CM

## 2017-07-11 DIAGNOSIS — B369 Superficial mycosis, unspecified: Secondary | ICD-10-CM

## 2017-07-11 DIAGNOSIS — H6242 Otitis externa in other diseases classified elsewhere, left ear: Secondary | ICD-10-CM

## 2017-07-11 DIAGNOSIS — B0229 Other postherpetic nervous system involvement: Secondary | ICD-10-CM

## 2017-07-11 MED ORDER — PREDNISONE 10 MG PO TABS: ORAL_TABLET | ORAL | 0 refills | Status: DC

## 2017-07-11 MED ORDER — GABAPENTIN 100 MG PO CAPS: 100.0000 mg | ORAL_CAPSULE | Freq: Three times a day (TID) | ORAL | 0 refills | Status: DC

## 2017-07-11 MED ORDER — VALACYCLOVIR HCL 1 G PO TABS: 1000.0000 mg | ORAL_TABLET | Freq: Three times a day (TID) | ORAL | 0 refills | Status: AC

## 2017-07-11 MED ORDER — CLOTRIMAZOLE 1 % EX SOLN: 1.0000 "application " | Freq: Two times a day (BID) | CUTANEOUS | 0 refills | Status: AC

## 2017-07-11 NOTE — Telephone Encounter (Signed)
The pt call requesting a refill on his Valtrex. He states that he just finished a 7 day dosage. He state his symptoms started to improve, but did not totally resolve. He also complaining of some  Ear pain that worsen when he turns his head. He wanted to see if he could be seen today. Please advise

## 2017-07-11 NOTE — Patient Instructions (Addendum)
Scott Campos,   Thank you for coming in to clinic today.  1. You have shingles - START valacyclovir 1 g three times daily for 7 days. - START gabapentin 100 mg at bedtime up to three times daily as needed for nerve pain - START prednisone taper for shingles. (Optional for cost)   2. START Shingrix vaccine in about 2 months to prevent future shingles.  3. You also have a fungal left ear infection. - START clotrimazole ointment twice daily into left ear for 10 days. - May use lotion(Athlete's foot cream) at opening of ear if drops are costly.  Please schedule a follow-up appointment with Scott Campos, AGNP. Return if symptoms worsen or fail to improve.  If you have any other questions or concerns, please feel free to call the clinic or send a message through MyChart. You may also schedule an earlier appointment if necessary.  You will receive a survey after today's visit either digitally by e-mail or paper by [employer] mail. Your experiences and feedback matter to us.  Please respond so we know how we are doing as we provide care for you.   Scott McardleLauren Jarrel Knoke, DNP, AGNP-BC Adult Gerontology Nurse Practitioner [facility] [place] [facility], [facility]

## 2017-07-11 NOTE — Progress Notes (Signed)
Subjective:    Patient ID: Scott Campos, male    DOB: 26-Feb-1970, 47 y.o.   MRN: [id number]  Scott Campos is a 47 y.o. male presenting on 07/11/2017 for Ear Pain (severe left side ear pain, swelling on the left side of the face and lip. Pt thinks it related to herpes outbreak. )   HPI Ear Pain Patient presents today for evaluation of LEFT ear pain with swelling on left face, lip.  He has history of herpes simplex, but his rash is also on his cheek and did not significantly improve with his antiviral.   - He is also noting pain is causing poor sleep habits, cold and warm water is bothering a tooth pain (but has chipped tooth for long term issues).   - Hair hurts on left side of head, but can touch the hair.  Social History   Tobacco Use  . Smoking status: Current Every Day Smoker    Packs/day: 0.50    Years: 15.00    Pack years: 7.50    Types: Cigarettes  . Smokeless tobacco: Never Used  Substance Use Topics  . Alcohol use: No    Frequency: Never  . Drug use: No    Review of Systems Per HPI unless specifically indicated above     Objective:    BP 129/83 (BP Location: Right Arm, Patient Position: Sitting, Cuff Size: Normal)   Pulse 97   Temp 98.3 F (36.8 C) (Oral)   Ht 5\' 5"  (1.651 m)   Wt 268 lb 6.4 oz (121.7 kg)   BMI 44.66 kg/m   Wt Readings from Last 3 Encounters:  07/11/17 268 lb 6.4 oz (121.7 kg)  04/05/17 272 lb 9.6 oz (123.7 kg)  03/18/17 272 lb 6.4 oz (123.6 kg)    Physical Exam  Constitutional: He is oriented to person, place, and time. He appears well-developed and well-nourished. No distress.  HENT:  Head: Normocephalic and atraumatic.    Right Ear: Hearing, tympanic membrane, external ear and ear canal normal.  Left Ear: Hearing, tympanic membrane and external ear normal. There is drainage (canal with white and black dots in ear, fuzzy appearance) and tenderness.  Nose: Nose normal.  Mouth/Throat: Uvula is midline, oropharynx is  clear and moist and mucous membranes are normal. No oral lesions. Abnormal dentition. No dental abscesses or uvula swelling.  Cardiovascular: Normal rate, regular rhythm, S1 normal, S2 normal, normal heart sounds and intact distal pulses.  Pulmonary/Chest: Effort normal and breath sounds normal. No respiratory distress.  Neurological: He is alert and oriented to person, place, and time.  Skin: Skin is warm and dry.  Psychiatric: He has a normal mood and affect. His behavior is normal.  Vitals reviewed.    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  No hearing complications, but is experiencing  ear pain.  Offered ENT referral, but patient declines today.  Strict return precautions given. - START valacyclovir 1000 mg tid x 7 days - Start prednisone taper over 7 days Day 1-2: 60 mg, Day 3: 50 mg, Day 4: 40 mg; Day 5: 30 mg; Day 6 20 mg; Day 7: 10 mg then stop. - Start gabapentin 100 mg tid for neuropathic pain - herpetic neuralgia - Followup prn if no improvement.  # Otitis externa - fungal. Mild acute fungal infection of left ear canal.   - START lotrimin solution -  Place 3-4 drops bid for 10 days - followup prn   Meds ordered this encounter  Medications  . valACYclovir (VALTREX) 1000 MG tablet    Sig: Take 1 tablet (1,000 mg total) by mouth 3 (three) times daily for 7 days.    Dispense:  21 tablet    Refill:  0    Order Specific Question:    Supervising Provider    Answer:   Smitty CordsKARAMALEGOS, Scott J [[id number]]  . predniSONE (DELTASONE) 10 MG tablet    Sig: Day 1-2 take 6 pills. Day 3 take 5 pills then reduce by 1 pill each day.    Dispense:  27 tablet    Refill:  0    Order Specific Question:   Supervising Provider    Answer:   Smitty CordsKARAMALEGOS, Scott J [[id number]]  . gabapentin (NEURONTIN) 100 MG capsule    Sig: Take 1 capsule (100 mg total) by mouth 3 (three) times daily.    Dispense:  90 capsule    Refill:  0    Order Specific Question:   Supervising Provider    Answer:   Smitty CordsKARAMALEGOS, Scott J [[id number]]  . clotrimazole (LOTRIMIN) 1 % external solution    Sig: Apply 1 application topically 2 (two) times daily for 10 days. Apply to left ear    Dispense:  30 mL    Refill:  0    Order Specific Question:   Supervising Provider    Answer:   Smitty CordsKARAMALEGOS, Scott J [[id number]]    Follow up plan: Return if symptoms worsen or fail to improve.  Wilhelmina McardleLauren Zed Wanninger, DNP, AGPCNP-BC Adult Gerontology Primary Care Nurse Practitioner [facility] [place] [facility] 07/11/2017, 2:17 PM

## 2017-07-12 ENCOUNTER — Ambulatory Visit: Payer: Self-pay | Admitting: Nurse Practitioner

## 2017-08-17 ENCOUNTER — Encounter: Payer: Self-pay | Admitting: Nurse Practitioner

## 2017-10-09 ENCOUNTER — Other Ambulatory Visit: Payer: Self-pay | Admitting: Nurse Practitioner

## 2017-10-09 DIAGNOSIS — I10 Essential (primary) hypertension: Secondary | ICD-10-CM

## 2017-10-09 DIAGNOSIS — B0229 Other postherpetic nervous system involvement: Secondary | ICD-10-CM

## 2017-11-29 ENCOUNTER — Encounter: Payer: Self-pay | Admitting: Nurse Practitioner

## 2017-11-29 ENCOUNTER — Other Ambulatory Visit: Payer: Self-pay

## 2017-11-29 ENCOUNTER — Ambulatory Visit (INDEPENDENT_AMBULATORY_CARE_PROVIDER_SITE_OTHER): Payer: Self-pay | Admitting: Nurse Practitioner

## 2017-11-29 VITALS — BP 143/83 | HR 96 | Temp 98.2°F | Resp 17 | Ht 65.0 in | Wt 271.8 lb

## 2017-11-29 DIAGNOSIS — R0609 Other forms of dyspnea: Secondary | ICD-10-CM

## 2017-11-29 DIAGNOSIS — R0602 Shortness of breath: Secondary | ICD-10-CM

## 2017-11-29 DIAGNOSIS — Z9189 Other specified personal risk factors, not elsewhere classified: Secondary | ICD-10-CM

## 2017-11-29 DIAGNOSIS — I10 Essential (primary) hypertension: Secondary | ICD-10-CM

## 2017-11-29 DIAGNOSIS — R350 Frequency of micturition: Secondary | ICD-10-CM

## 2017-11-29 DIAGNOSIS — M7989 Other specified soft tissue disorders: Secondary | ICD-10-CM

## 2017-11-29 DIAGNOSIS — N401 Enlarged prostate with lower urinary tract symptoms: Secondary | ICD-10-CM

## 2017-11-29 DIAGNOSIS — R0600 Dyspnea, unspecified: Secondary | ICD-10-CM

## 2017-11-29 MED ORDER — HYDROCHLOROTHIAZIDE 25 MG PO TABS: 25.0000 mg | ORAL_TABLET | Freq: Every day | ORAL | 1 refills | Status: DC

## 2017-11-29 MED ORDER — ALBUTEROL SULFATE HFA 108 (90 BASE) MCG/ACT IN AERS: 1.0000 | INHALATION_SPRAY | Freq: Four times a day (QID) | RESPIRATORY_TRACT | 5 refills | Status: DC | PRN

## 2017-11-29 MED ORDER — TAMSULOSIN HCL 0.4 MG PO CAPS: 0.4000 mg | ORAL_CAPSULE | Freq: Every day | ORAL | 2 refills | Status: DC

## 2017-11-29 MED ORDER — LISINOPRIL 20 MG PO TABS: 20.0000 mg | ORAL_TABLET | Freq: Every day | ORAL | 0 refills | Status: DC

## 2017-11-29 NOTE — Patient Instructions (Addendum)
Scott Campos,   Thank you for coming in to clinic today.  1. [facility] will call to schedule your sleep study.  2. [facility] (Cardiology) will call to schedule your appointment.  3. INCREASE hydrochlorothiazide to 25 mg once daily.   - CONTINUE lisinopril 20 mg once daily.  4. START tamsulosin 0.4 mg one tablet once daily.  Please schedule a follow-up appointment with [name], AGNP. Return in about 3 months (around 03/01/2018) for hypertension, shortness of breath.  Call for sooner appointment if needed for worsening symptoms.   If you have any other questions or concerns, please feel free to call the clinic or send a message through MyChart. You may also schedule an earlier appointment if necessary.  You will receive a survey after today's visit either digitally by e-mail or paper by [employer] mail. Your experiences and feedback matter to us.  Please respond so we know how we are doing as we provide care for you.   Wilhelmina McardleLauren Legrand Lasser, DNP, AGNP-BC Adult Gerontology Nurse Practitioner [facility], [facility]   Shortness of Breath, Adult Shortness of breath means you have trouble breathing. Your lungs are organs for breathing. Follow these instructions at home: Pay attention to any changes in your symptoms. Take these actions to help with your condition:  Do not smoke. Smoking can cause shortness of breath. If you need help to quit smoking, ask your doctor.  Avoid things that can make it harder to breathe, such as: ? Mold. ? Dust. ? Air pollution. ? Chemical smells. ? Things that can cause allergy symptoms (allergens), if you have allergies.  Keep your living space clean and free of mold and dust.  Rest as needed. Slowly return to your usual activities.  Take over-the-counter and prescription medicines, including oxygen and inhaled medicines, only as told by your doctor.  Keep all follow-up visits as told by your doctor. This is  important.  Contact a doctor if:  Your condition does not get better as soon as expected.  You have a hard time doing your normal activities, even after you rest.  You have new symptoms. Get help right away if:  You have trouble breathing when you are resting.  You feel light-headed or you faint.  You have a cough that is not helped by medicines.  You cough up blood.  You have pain with breathing.  You have pain in your chest, arms, shoulders, or belly (abdomen).  You have a fever.  You cannot walk up stairs.  You cannot exercise the way you normally do. This information is not intended to replace advice given to you by your health care provider. Make sure you discuss any questions you have with your health care provider. Document Released: 06/23/2007 Document Revised: 01/22/2016 Document Reviewed: 01/22/2016 Elsevier Interactive Patient Education  2017 [employer]

## 2017-11-29 NOTE — Progress Notes (Signed)
Subjective:    Patient ID: Scott Campos, male    DOB: Dec 28, 1970, 47 y.o.   MRN: [id number]  Scott Campos is a 47 y.o. male presenting on 11/29/2017 for Shortness of Breath (extreme SOB w/ exertion x 6-8 mths . Pt requesting a referral to Cardiology ) and Urinary Frequency (pt getting up 8-10 times at night to go to the restroom )   HPI Shortness of Breath Has shortness of breath with minimal exertion with one flight of stairs.  Walking sort distances creates shortness of breath and weakness as well.    Initially thought he was out of shape after time off, but shortness of breath is not improving at all.  Usually occurs only with activity.  Was having some severe chest pain during cold weather last year with shortness of breath.  Urinary Frequency Daytime urinary frequency dependent upon coffee, water intake - seems normal for daytime. - Wake about 1-1.5 hours for urination overnight. - Usually has complete bladder emptying - sometimes has small void amounts - no difficulty starting stream - sometimes has incontinence (urge) - Previously has had prostate problems and took Flomax at that time with some symptom improvement.  Epworth Sleepiness Scale Patient's Answer Chance of dozing off under normal circumstances  Sitting and reading  0   Watching TV 0 0 = Never  Sitting inactive in a public place 0 1 = Slight chance  As a passenger in a car for an hour without a break 1 2 = Moderate chance  Lying down to rest in the afternoon when circumstances permit 0 3 = High chance  Sitting and talking to someone 0   Sitting quietly after a lunch without alcohol 0   In a car, while stopped for a few minutes in traffic 0                                                              Total Score: 1    0-7: It is unlikely that you are abnormally sleepy. 8-9: You have an average amount of daytime sleepiness. >9: POSITIVE - Recommend further evaluation, sleep specialist or sleep study  (>16-24 = severe)   STOP-Bang OSA (scoring y/n) Snoring n unknown  Tiredness y   Observed apneas n   Pressure HTN y   BMI > 35 kg/m2 y   Age > 47  n   Neck (male >17 in; Male >46 in)  y 29.75 in  Gender male y   OSA risk low (0-2)  OSA risk intermediate (3-4)  OSA risk high (5+)  Total: 5    Social History   Tobacco Use  . Smoking status: Current Every Day Smoker    Packs/day: 0.50    Years: 15.00    Pack years: 7.50    Types: Cigarettes  . Smokeless tobacco: Never Used  Substance Use Topics  . Alcohol use: No    Frequency: Never  . Drug use: No    Review of Systems Per HPI unless specifically indicated above     Objective:    BP (!) 143/83 (BP Location: Right Arm, Patient Position: Sitting, Cuff Size: Large)   Pulse 96   Temp 98.2 F (36.8 C) (Oral)   Resp 17   Ht 5\' 5"  (1.651 m)  Wt 271 lb 12.8 oz (123.3 kg)   SpO2 96%   BMI 45.23 kg/m   Wt Readings from Last 3 Encounters:  11/29/17 271 lb 12.8 oz (123.3 kg)  07/11/17 268 lb 6.4 oz (121.7 kg)  04/05/17 272 lb 9.6 oz (123.7 kg)    Physical Exam  Constitutional: He is oriented to person, place, and time. He appears well-developed and well-nourished. No distress.  HENT:  Head: Normocephalic and atraumatic.  Neck: Normal range of motion. Neck supple. Carotid bruit is not present.  Cardiovascular: Normal rate, regular rhythm, S1 normal, S2 normal, normal heart sounds and intact distal pulses. Exam reveals no gallop and no friction rub.  No murmur heard. Pulmonary/Chest: Effort normal and breath sounds normal. No respiratory distress. He has no decreased breath sounds. He has no wheezes. He has no rhonchi. He has no rales.  Abdominal: Soft. Bowel sounds are normal. He exhibits no distension. There is no tenderness.  Musculoskeletal: He exhibits no edema (pedal).  Neurological: He is alert and oriented to person, place, and time.  Skin: Skin is warm and dry. Capillary refill takes less than 2 seconds.    Psychiatric: He has a normal mood and affect. His behavior is normal. His mood appears not anxious. He is not agitated.  Vitals reviewed.    Results for orders placed or performed in visit on 04/05/17  POCT Influenza A/B  Result Value Ref Range   Influenza A, POC Negative Negative   Influenza B, POC Negative Negative      Assessment & Plan:   Problem List Items Addressed This Visit      Cardiovascular and Mediastinum   Essential hypertension Poorly controlled hypertension.  BP goal < 130/80.  Pt is working on lifestyle modifications.  Taking medications tolerating well without side effects. Complications: possible sleep apnea  Plan: 1. Continue taking lisinopril 20 mg once daily. - Increase HCTZ to 25 mg once daily 2. Last kidney function normal.  Recheck 3 months.  3. Encouraged heart healthy diet and increasing exercise to 30 minutes most days of the week. 4. Check BP 1-2 x per week at home, keep log, and bring to clinic at next appointment. 5. Follow up 3 months.     Relevant Medications   hydrochlorothiazide (HYDRODIURIL) 25 MG tablet   lisinopril (PRINIVIL,ZESTRIL) 20 MG tablet    Other Visit Diagnoses    Dyspnea on exertion    -  Primary Patient with shortness of breath on exertion and new BLE edema.  No prior cardiac or pulmonary workup.  Progressive dyspnea indicates possible cardiac cause.  Plan: 1. Patient likely needs stress test - referral to Cardiology. 2. Patient also at high risk for OSA.  See below - sleep study ordered. 3. Follow-up prn.  If worsening symptoms, may seek care sooner than 3 months.  Reviewed emergencies and when to go to ER.  Patient verbalizes understanding.   Relevant Orders   Ambulatory referral to Cardiology   PSG SLEEP STUDY   Shortness of breath     See dyspnea on exertion    Relevant Medications   albuterol (PROVENTIL HFA;VENTOLIN HFA) 108 (90 Base) MCG/ACT inhaler   Swelling of lower leg     See dyspnea on exertion.    Benign  prostatic hyperplasia with urinary frequency     Patient with likely BPH.  Nocturia may also be indicator of OSA.  See OSA below.  Plan: 1. START tamsulosin 0.4 mg once daily. 2. Sleep study ordered. 3. Follow-up  3 months and prn.   Relevant Medications   tamsulosin (FLOMAX) 0.4 MG CAPS capsule   Other Relevant Orders   PSG SLEEP STUDY   At risk for obstructive sleep apnea     Patient has positive risk screenings for OSA ( STOP-Bang, and Epworth).  Body habitus and symptomatology today with dyspnea on exertion, shortness of breath, BLE edema, and nocturia indicate need for screening sleep study. - Sleep study ordered - Follow-up after sleep study and prn.      Meds ordered this encounter  Medications  . tamsulosin (FLOMAX) 0.4 MG CAPS capsule    Sig: Take 1 capsule (0.4 mg total) by mouth daily.    Dispense:  30 capsule    Refill:  2    Order Specific Question:   Supervising Provider    Answer:   Smitty Cords [[id number]]  . albuterol (PROVENTIL HFA;VENTOLIN HFA) 108 (90 Base) MCG/ACT inhaler    Sig: Inhale 1-2 puffs into the lungs every 6 (six) hours as needed for wheezing or shortness of breath.    Dispense:  1 Inhaler    Refill:  5    Product selection permitted for pt/insurance brand preference.    Order Specific Question:   Supervising Provider    Answer:   Smitty Cords [[id number]]  . hydrochlorothiazide (HYDRODIURIL) 25 MG tablet    Sig: Take 1 tablet (25 mg total) by mouth daily.    Dispense:  90 tablet    Refill:  1    Order Specific Question:   Supervising Provider    Answer:   Smitty Cords [[id number]]  . lisinopril (PRINIVIL,ZESTRIL) 20 MG tablet    Sig: Take 1 tablet (20 mg total) by mouth daily.    Dispense:  90 tablet    Refill:  0    Order Specific Question:   Supervising Provider    Answer:   Smitty Cords [[id number]]   Follow up plan: Return in about 3 months (around 03/01/2018) for hypertension, shortness of breath.  [name], DNP, AGPCNP-BC Adult Gerontology Primary Care Nurse Practitioner [facility] 11/29/2017, 11:29 AM

## 2017-12-08 ENCOUNTER — Encounter: Payer: Self-pay | Admitting: Nurse Practitioner

## 2017-12-22 ENCOUNTER — Other Ambulatory Visit: Payer: Self-pay | Admitting: Nurse Practitioner

## 2017-12-22 DIAGNOSIS — R059 Cough, unspecified: Secondary | ICD-10-CM

## 2017-12-22 DIAGNOSIS — J22 Unspecified acute lower respiratory infection: Secondary | ICD-10-CM

## 2017-12-22 DIAGNOSIS — R05 Cough: Secondary | ICD-10-CM

## 2017-12-30 ENCOUNTER — Other Ambulatory Visit: Payer: Self-pay | Admitting: Nurse Practitioner

## 2017-12-30 DIAGNOSIS — I10 Essential (primary) hypertension: Secondary | ICD-10-CM

## 2018-01-20 ENCOUNTER — Emergency Department
Admission: EM | Admit: 2018-01-20 | Discharge: 2018-01-20 | Disposition: A | Discharge status: Home / Self Care | Payer: Self-pay | Attending: Emergency Medicine | Admitting: Emergency Medicine

## 2018-01-20 ENCOUNTER — Other Ambulatory Visit: Payer: Self-pay

## 2018-01-20 ENCOUNTER — Encounter: Payer: Self-pay | Admitting: Emergency Medicine

## 2018-01-20 DIAGNOSIS — I10 Essential (primary) hypertension: Secondary | ICD-10-CM | POA: Insufficient documentation

## 2018-01-20 DIAGNOSIS — F17210 Nicotine dependence, cigarettes, uncomplicated: Secondary | ICD-10-CM | POA: Insufficient documentation

## 2018-01-20 DIAGNOSIS — Z79899 Other long term (current) drug therapy: Secondary | ICD-10-CM | POA: Insufficient documentation

## 2018-01-20 DIAGNOSIS — M5432 Sciatica, left side: Secondary | ICD-10-CM | POA: Insufficient documentation

## 2018-01-20 MED ORDER — METHOCARBAMOL 500 MG PO TABS: 500.0000 mg | ORAL_TABLET | Freq: Four times a day (QID) | ORAL | 0 refills | Status: DC

## 2018-01-20 MED ORDER — ORPHENADRINE CITRATE 30 MG/ML IJ SOLN
60.0000 mg | Freq: Once | INTRAMUSCULAR | Status: AC
  Administered 2018-01-20: 60 mg via INTRAMUSCULAR
  Filled 2018-01-20: qty 2

## 2018-01-20 MED ORDER — MELOXICAM 15 MG PO TABS: 15.0000 mg | ORAL_TABLET | Freq: Every day | ORAL | 0 refills | Status: DC

## 2018-01-20 MED ORDER — KETOROLAC TROMETHAMINE 30 MG/ML IJ SOLN
30.0000 mg | Freq: Once | INTRAMUSCULAR | Status: AC
  Administered 2018-01-20: 30 mg via INTRAMUSCULAR
  Filled 2018-01-20: qty 1

## 2018-01-20 NOTE — ED Provider Notes (Signed)
[facility] Emergency Department Provider Note  ____________________________________________  Time seen: Approximately 3:27 PM  I have reviewed the triage vital signs and the nursing notes.   HISTORY  Chief Complaint Back Pain    HPI Scott Campos is a 48 y.o. male who presents the emergency department complaining of left-sided lower back pain.  Patient reports that symptoms have been going for the past 7 to 10 days.  He denies any trauma or injury.  No history of chronic back pain.  Patient does not have any radicular symptoms but describes the pain as a burning sensation radiating from his back into his buttocks.  No bowel or bladder dysfunction, saddle anesthesia, paresthesias.  Patient has been taking ibuprofen without significant relief.    Past Medical History:  Diagnosis Date  . Bipolar 1 disorder (HCC)   . Cleft palate and lip   . Malachi Carl infection   . Hypertension   . Right knee injury     Patient Active Problem List   Diagnosis Date Noted  . Essential hypertension 02/15/2017    Past Surgical History:  Procedure Laterality Date  . CLEFT PALATE REPAIR      Prior to Admission medications   Medication Sig Start Date End Date Taking? Authorizing Provider  albuterol (PROVENTIL HFA;VENTOLIN HFA) 108 (90 Base) MCG/ACT inhaler Inhale 1-2 puffs into the lungs every 6 (six) hours as needed for wheezing or shortness of breath. 11/29/17   Galen Manila, NP  benzonatate (TESSALON) 100 MG capsule TAKE 1 CAPSULE BY MOUTH 3 TIMES DAILY ASNEEDED COUGH 12/23/17   Karamalegos, Netta Neat, DO  gabapentin (NEURONTIN) 100 MG capsule TAKE 1 CAPSULE BY MOUTH 3 TIMES DAILY Patient not taking: Reported on 11/29/2017 10/10/17   Galen Manila, NP  hydrochlorothiazide (HYDRODIURIL) 25 MG tablet Take 1 tablet (25 mg total) by mouth daily. 11/29/17   Galen Manila, NP  ibuprofen (ADVIL,MOTRIN) 200 MG tablet Take 200 mg by mouth every 6  (six) hours as needed.    [provider]  ipratropium (ATROVENT) 0.06 % nasal spray Place 2 sprays into both nostrils 4 (four) times daily. 04/05/17   Galen Manila, NP  lisinopril (PRINIVIL,ZESTRIL) 20 MG tablet TAKE 1 TABLET BY MOUTH ONCE DAILY 01/02/18   Galen Manila, NP  meloxicam (MOBIC) 15 MG tablet Take 1 tablet (15 mg total) by mouth daily. 01/20/18   Cuthriell, Delorise Royals, PA-C  methocarbamol (ROBAXIN) 500 MG tablet Take 1 tablet (500 mg total) by mouth 4 (four) times daily. 01/20/18   Cuthriell, Delorise Royals, PA-C  Multiple Vitamin (MULTI VITAMIN DAILY PO) Take by mouth.    [provider]  predniSONE (DELTASONE) 10 MG tablet Day 1-2 take 6 pills. Day 3 take 5 pills then reduce by 1 pill each day. Patient not taking: Reported on 11/29/2017 07/11/17   Galen Manila, NP  tamsulosin (FLOMAX) 0.4 MG CAPS capsule Take 1 capsule (0.4 mg total) by mouth daily. 11/29/17   Galen Manila, NP    Allergies Darvon [propoxyphene]  Family History  Problem Relation Age of Onset  . Leukemia Mother   . Diabetes Father   . Hypertension Father     Social History Social History   Tobacco Use  . Smoking status: Current Every Day Smoker    Packs/day: 0.50    Years: 15.00    Pack years: 7.50    Types: Cigarettes  . Smokeless tobacco: Never Used  Substance Use Topics  . Alcohol  use: No    Frequency: Never  . Drug use: No     Review of Systems  Constitutional: No fever/chills Eyes: No visual changes.  Cardiovascular: no chest pain. Respiratory: no cough. No SOB. Gastrointestinal: No abdominal pain.  No nausea, no vomiting.  No diarrhea.  No constipation. Genitourinary: Negative for dysuria. No hematuria Musculoskeletal: Positive for left lower back pain radiating into the left buttocks Skin: Negative for rash, abrasions, lacerations, ecchymosis. Neurological: Negative for headaches, focal weakness or numbness. 10-point ROS otherwise  negative.  ____________________________________________   PHYSICAL EXAM:  VITAL SIGNS: ED Triage Vitals  Enc Vitals Group     BP 01/20/18 1426 (!) 145/97     Pulse Rate 01/20/18 1426 (!) 103     Resp 01/20/18 1426 16     Temp 01/20/18 1426 97.8 F (36.6 C)     Temp Source 01/20/18 1426 Oral     SpO2 01/20/18 1426 96 %     Weight 01/20/18 1427 240 lb (108.9 kg)     Height 01/20/18 1427 5\' 7"  (1.702 m)     Head Circumference --      Peak Flow --      Pain Score 01/20/18 1426 5     Pain Loc --      Pain Edu? --      Excl. in GC? --      Constitutional: Alert and oriented. Well appearing and in no acute distress. Eyes: Conjunctivae are normal. PERRL. EOMI. Head: Atraumatic. Neck: No stridor.    Cardiovascular: Normal rate, regular rhythm. Normal S1 and S2.  Good peripheral circulation. Respiratory: Normal respiratory effort without tachypnea or retractions. Lungs CTAB. Good air entry to the bases with no decreased or absent breath sounds. Gastrointestinal: Bowel sounds 4 quadrants. Soft and nontender to palpation. No guarding or rigidity. No palpable masses. No distention. No CVA tenderness. Musculoskeletal: Full range of motion to all extremities. No gross deformities appreciated.  Visualization of the lumbar spine reveals no visible abnormality.  Nontender to palpation midline.  Patient is tender to palpation along the sciatic nerve distribution radiating from the lower lumbar region into the left sciatic notch.  No other tenderness to palpation.  No palpable abnormality.  Dorsalis pedis pulse intact bilateral lower extremities.  Sensation intact and equal bilateral lower extremities. Neurologic:  Normal speech and language. No gross focal neurologic deficits are appreciated.  Skin:  Skin is warm, dry and intact. No rash noted. Psychiatric: Mood and affect are normal. Speech and behavior are normal. Patient exhibits appropriate insight and  judgement.   ____________________________________________   LABS (all labs ordered are listed, but only abnormal results are displayed)  Labs Reviewed - No data to display ____________________________________________  EKG   ____________________________________________  RADIOLOGY   No results found.  ____________________________________________    PROCEDURES  Procedure(s) performed:    Procedures    Medications  ketorolac (TORADOL) 30 MG/ML injection 30 mg (has no administration in time range)  orphenadrine (NORFLEX) injection 60 mg (has no administration in time range)     ____________________________________________   INITIAL IMPRESSION / ASSESSMENT AND PLAN / ED COURSE  Pertinent labs & imaging results that were available during my care of the patient were reviewed by me and considered in my medical decision making (see chart for details).  Review of the [place] CSRS was performed in accordance of the [facility] prior to dispensing any controlled drugs.      Patient's diagnosis is consistent with sciatica.  Patient presents the  emergency department complaining of a burning sensation along the left sciatic nerve distribution.  No known injury or trauma.  No urinary symptoms.  No GI symptoms.  Exam was consistent with sciatica.  Patient will be given Toradol and Norflex in the emergency department.. Patient will be discharged home with prescriptions for meloxicam and Robaxin. Patient is to follow up with primary care as needed or otherwise directed. Patient is given ED precautions to return to the ED for any worsening or new symptoms.     ____________________________________________  FINAL CLINICAL IMPRESSION(S) / ED DIAGNOSES  Final diagnoses:  Sciatica of left side      NEW MEDICATIONS STARTED DURING THIS VISIT:  ED Discharge Orders         Ordered    meloxicam (MOBIC) 15 MG tablet  Daily     01/20/18 1541    methocarbamol (ROBAXIN) 500 MG tablet  4  times daily     01/20/18 1541              This chart was dictated using voice recognition software/Dragon. Despite best efforts to proofread, errors can occur which can change the meaning. Any change was purely unintentional.    Racheal Patches, PA-C 01/20/18 1541    Phineas Semen, MD 01/20/18 [phone]

## 2018-01-20 NOTE — ED Triage Notes (Signed)
Left lower back pain for 1.5 weeks.

## 2018-01-30 ENCOUNTER — Ambulatory Visit: Payer: Self-pay | Admitting: Internal Medicine

## 2018-02-14 ENCOUNTER — Emergency Department: Payer: Self-pay

## 2018-02-14 ENCOUNTER — Other Ambulatory Visit: Payer: Self-pay

## 2018-02-14 ENCOUNTER — Encounter: Payer: Self-pay | Admitting: Emergency Medicine

## 2018-02-14 ENCOUNTER — Emergency Department
Admission: EM | Admit: 2018-02-14 | Discharge: 2018-02-14 | Disposition: A | Discharge status: Home / Self Care | Payer: Self-pay | Attending: Emergency Medicine | Admitting: Emergency Medicine

## 2018-02-14 DIAGNOSIS — W11XXXA Fall on and from ladder, initial encounter: Secondary | ICD-10-CM | POA: Insufficient documentation

## 2018-02-14 DIAGNOSIS — Y998 Other external cause status: Secondary | ICD-10-CM | POA: Insufficient documentation

## 2018-02-14 DIAGNOSIS — M549 Dorsalgia, unspecified: Secondary | ICD-10-CM

## 2018-02-14 DIAGNOSIS — Y9389 Activity, other specified: Secondary | ICD-10-CM | POA: Insufficient documentation

## 2018-02-14 DIAGNOSIS — I10 Essential (primary) hypertension: Secondary | ICD-10-CM | POA: Insufficient documentation

## 2018-02-14 DIAGNOSIS — S39012A Strain of muscle, fascia and tendon of lower back, initial encounter: Secondary | ICD-10-CM | POA: Insufficient documentation

## 2018-02-14 DIAGNOSIS — F319 Bipolar disorder, unspecified: Secondary | ICD-10-CM | POA: Insufficient documentation

## 2018-02-14 DIAGNOSIS — Z79899 Other long term (current) drug therapy: Secondary | ICD-10-CM | POA: Insufficient documentation

## 2018-02-14 DIAGNOSIS — Y929 Unspecified place or not applicable: Secondary | ICD-10-CM | POA: Insufficient documentation

## 2018-02-14 DIAGNOSIS — W19XXXA Unspecified fall, initial encounter: Secondary | ICD-10-CM

## 2018-02-14 DIAGNOSIS — F17210 Nicotine dependence, cigarettes, uncomplicated: Secondary | ICD-10-CM | POA: Insufficient documentation

## 2018-02-14 MED ORDER — NAPROXEN 500 MG PO TABS: 500.0000 mg | ORAL_TABLET | Freq: Two times a day (BID) | ORAL | 2 refills | Status: DC

## 2018-02-14 MED ORDER — KETOROLAC TROMETHAMINE 30 MG/ML IJ SOLN
30.0000 mg | Freq: Once | INTRAMUSCULAR | Status: AC
  Administered 2018-02-14: 30 mg via INTRAMUSCULAR
  Filled 2018-02-14: qty 1

## 2018-02-14 NOTE — ED Notes (Signed)

## 2018-02-14 NOTE — ED Notes (Signed)
Patient triaged during down time. Patient reports falling off a ladder and landing hard on heels and then falling back on to bottom. Complaining of numbness in bottom and pain across lower back

## 2018-02-14 NOTE — ED Provider Notes (Signed)
[facility] Emergency Department Provider Note   ____________________________________________    I have reviewed the triage vital signs and the nursing notes.   HISTORY  Chief Complaint Trauma and Fall     HPI Scott Campos is a 48 y.o. male who presents after a fall from a ladder.  Patient reports he lost his balance and fell approximately 6 rungs down a ladder and landed on his heels and then fell backwards.  Denies head injury or neck pain.  Complains of primarily low back pain.  Reports he recently recovered from sciatica and his symptoms now feel similar primarily the pain is on the left lower back with some radiation into his left leg.  He is able to ambulate.  Has not take anything for this  Past Medical History:  Diagnosis Date  . Bipolar 1 disorder (HCC)   . Cleft palate and lip   . Malachi CarlEpstein Barr infection   . Hypertension   . Right knee injury     Patient Active Problem List   Diagnosis Date Noted  . Essential hypertension 02/15/2017    Past Surgical History:  Procedure Laterality Date  . CLEFT PALATE REPAIR      Prior to Admission medications   Medication Sig Start Date End Date Taking? Authorizing Provider  albuterol (PROVENTIL HFA;VENTOLIN HFA) 108 (90 Base) MCG/ACT inhaler Inhale 1-2 puffs into the lungs every 6 (six) hours as needed for wheezing or shortness of breath. 11/29/17   Galen ManilaKennedy, Lauren Renee, NP  benzonatate (TESSALON) 100 MG capsule TAKE 1 CAPSULE BY MOUTH 3 TIMES DAILY ASNEEDED COUGH 12/23/17   Karamalegos, Netta NeatAlexander J, DO  gabapentin (NEURONTIN) 100 MG capsule TAKE 1 CAPSULE BY MOUTH 3 TIMES DAILY Patient not taking: Reported on 11/29/2017 10/10/17   Galen ManilaKennedy, Lauren Renee, NP  hydrochlorothiazide (HYDRODIURIL) 25 MG tablet Take 1 tablet (25 mg total) by mouth daily. 11/29/17   Galen ManilaKennedy, Lauren Renee, NP  ibuprofen (ADVIL,MOTRIN) 200 MG tablet Take 200 mg by mouth every 6 (six) hours as needed.    [provider]  ipratropium (ATROVENT) 0.06 % nasal spray Place 2 sprays into both nostrils 4 (four) times daily. 04/05/17   Galen ManilaKennedy, Lauren Renee, NP  lisinopril (PRINIVIL,ZESTRIL) 20 MG tablet TAKE 1 TABLET BY MOUTH ONCE DAILY 01/02/18   Galen ManilaKennedy, Lauren Renee, NP  meloxicam (MOBIC) 15 MG tablet Take 1 tablet (15 mg total) by mouth daily. 01/20/18   Cuthriell, Delorise RoyalsJonathan D, PA-C  methocarbamol (ROBAXIN) 500 MG tablet Take 1 tablet (500 mg total) by mouth 4 (four) times daily. 01/20/18   Cuthriell, Delorise RoyalsJonathan D, PA-C  Multiple Vitamin (MULTI VITAMIN DAILY PO) Take by mouth.    [provider]  naproxen (NAPROSYN) 500 MG tablet Take 1 tablet (500 mg total) by mouth 2 (two) times daily with a meal. 02/14/18   Jene EveryKinner, Gabor Lusk, MD  predniSONE (DELTASONE) 10 MG tablet Day 1-2 take 6 pills. Day 3 take 5 pills then reduce by 1 pill each day. Patient not taking: Reported on 11/29/2017 07/11/17   Galen ManilaKennedy, Lauren Renee, NP  tamsulosin (FLOMAX) 0.4 MG CAPS capsule Take 1 capsule (0.4 mg total) by mouth daily. 11/29/17   Galen ManilaKennedy, Lauren Renee, NP     Allergies Darvon [propoxyphene]  Family History  Problem Relation Age of Onset  . Leukemia Mother   . Diabetes Father   . Hypertension Father     Social History Social History   Tobacco Use  . Smoking status: Current Every Day Smoker  Packs/day: 0.50    Years: 15.00    Pack years: 7.50    Types: Cigarettes  . Smokeless tobacco: Never Used  Substance Use Topics  . Alcohol use: No    Frequency: Never  . Drug use: No    Review of Systems  Constitutional: No dizziness  ENT: No neck pain Respiratory: No shortness of breath CV: No chest wall pain Gastrointestinal: No abdominal pain.  No nausea, no vomiting.   Genitourinary: Negative for dysuria. Musculoskeletal: As above Skin: Abrasions to the arms Neurological: Negative for headaches, no neuro deficits    ____________________________________________   PHYSICAL EXAM:  VITAL  SIGNS: ED Triage Vitals  Enc Vitals Group     BP --      Pulse --      Resp --      Temp --      Temp src --      SpO2 --      Weight 02/14/18 1746 110 kg (242 lb 8.1 oz)     Height 02/14/18 1746 1.702 m (5\' 7" )     Head Circumference --      Peak Flow --      Pain Score 02/14/18 1755 4     Pain Loc --      Pain Edu? --      Excl. in GC? --      Constitutional: Alert and oriented. No acute distress. Eyes: Conjunctivae are normal.  Head: Atraumatic Nose: No swelling or epistaxis Mouth/Throat: Mucous membranes are moist.  No neck pain Cardiovascular: Normal rate, regular rhythm.  Chest wall tenderness to palpation Respiratory: Normal respiratory effort.  No retractions. Genitourinary: deferred Musculoskeletal: Back: No vertebral tenderness to palpation, left lower paraspinal muscle tenderness, normal strength in lower extremities, no saddle anesthesia Neurologic:  Normal speech and language. No gross focal neurologic deficits are appreciated.   Skin:  Skin is warm, dry, abrasions to the bilateral arms, minor   ____________________________________________   LABS (all labs ordered are listed, but only abnormal results are displayed)  Labs Reviewed - No data to display ____________________________________________  EKG   ____________________________________________  RADIOLOGY  X-ray lumbar and thoracic spine negative for fracture ____________________________________________   PROCEDURES  Procedure(s) performed: No  Procedures   Critical Care performed: No ____________________________________________   INITIAL IMPRESSION / ASSESSMENT AND PLAN / ED COURSE  Pertinent labs & imaging results that were available during my care of the patient were reviewed by me and considered in my medical decision making (see chart for details).  Patient treated with IM Toradol, x-rays negative for fracture.  Suspect lumbar strain, supportive care, naproxen Rx outpatient  follow-up   ____________________________________________   FINAL CLINICAL IMPRESSION(S) / ED DIAGNOSES  Final diagnoses:  Fall, initial encounter  Lumbar strain, initial encounter      NEW MEDICATIONS STARTED DURING THIS VISIT:  Discharge Medication List as of 02/14/2018  5:38 PM    START taking these medications   Details  naproxen (NAPROSYN) 500 MG tablet Take 1 tablet (500 mg total) by mouth 2 (two) times daily with a meal., Starting Tue 02/14/2018, Normal         Note:  This document was prepared using Dragon voice recognition software and may include unintentional dictation errors.   Jene Every, MD 02/14/18 Paulo Fruit

## 2018-03-01 ENCOUNTER — Ambulatory Visit: Payer: Self-pay | Admitting: Nurse Practitioner

## 2018-03-18 ENCOUNTER — Other Ambulatory Visit: Payer: Self-pay | Admitting: Nurse Practitioner

## 2018-03-18 DIAGNOSIS — I10 Essential (primary) hypertension: Secondary | ICD-10-CM

## 2018-03-28 ENCOUNTER — Ambulatory Visit: Payer: Self-pay | Admitting: Cardiovascular Disease

## 2018-03-28 ENCOUNTER — Encounter

## 2018-04-18 ENCOUNTER — Telehealth: Payer: Self-pay

## 2018-04-18 NOTE — Telephone Encounter (Signed)
Patient wants a note that he can not work during this time.  Please advise patient

## 2018-04-18 NOTE — Telephone Encounter (Signed)
I would recommend patient schedule with PCP for a Virtual Telephone visit to discuss his concerns and medical reasons to document any work note - as often these may turn into short term disability claims - and often not all patients meet criteria for disability due to leave at this time, it depends on their medical history.  Saralyn Pilar, DO [facility] 04/18/2018, 5:49 PM

## 2018-04-19 NOTE — Telephone Encounter (Signed)
The pt was scheduled for a visit with Lauren to discuss his concerns.

## 2018-04-19 NOTE — Telephone Encounter (Signed)
Attempted to contact the pt to notify him of Dr. Althea Charon recommendation. No answer, LMOM to return my call.

## 2018-04-20 ENCOUNTER — Ambulatory Visit (INDEPENDENT_AMBULATORY_CARE_PROVIDER_SITE_OTHER): Payer: Medicaid Other | Admitting: Nurse Practitioner

## 2018-04-20 ENCOUNTER — Encounter: Payer: Self-pay | Admitting: Nurse Practitioner

## 2018-04-20 ENCOUNTER — Other Ambulatory Visit: Payer: Self-pay

## 2018-04-20 DIAGNOSIS — R0602 Shortness of breath: Secondary | ICD-10-CM

## 2018-04-20 DIAGNOSIS — J45909 Unspecified asthma, uncomplicated: Secondary | ICD-10-CM

## 2018-04-20 MED ORDER — ALBUTEROL SULFATE HFA 108 (90 BASE) MCG/ACT IN AERS: 1.0000 | INHALATION_SPRAY | Freq: Four times a day (QID) | RESPIRATORY_TRACT | 5 refills | Status: DC | PRN

## 2018-04-20 NOTE — Progress Notes (Signed)
Subjective:    Patient ID: Scott Campos, male    DOB: [birth year], 48 y.o.   MRN: [id number]  Scott Campos is a 48 y.o. male presenting on 04/20/2018 for Congestive Heart Failure (pt concern that he is high risk with all of his health conditio. He want to see if you give him short term disability. )   HPI  Shortness of Breath Patient continues having problems with breathing.  Has pain in chest with cold weather associated with shortness of breath.  Patient has had about 3 episodes of pain/difficulty breathing.  Worse in winter.  Has not had any problems in warmer days.  When having shortness of breath, he notes he cannot talk. - Patient was previously referred to Cardiology and was noted First appt was 3 months away, but patient never was able to go to Cardiology for cancellations.  Works in [employer] each time he was scheduled.  - Patient has not been started on any inhalers for long-term use.  Patient has used albuterol with "burning in chest" when using this.  Continues nasal spray for allergy control. - Patient has been unable to work since start of the year due to being sick with chest pain/shortness of breath. 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 - Patient has received food stamps, medicaid family planning.  Continues having difficulty with affording medications and completing evaluation tests for symptoms.  - Patient notes only mild sinus congestion, cleft palate congestion.  Sister with same problem.  No fever, chills, or sweats. No signs and symptoms of focal or systemic infection.  Social History   Tobacco Use  . Smoking status: Current Every Day Smoker    Packs/day: 0.25    Years: 15.00    Pack years: 3.75    Types: Cigarettes  . Smokeless tobacco: Never Used  Substance Use Topics  . Alcohol use: No    Frequency: Never  . Drug use: No    Review of Systems Per HPI unless specifically indicated above     Objective:     There were no vitals taken for this visit.  Wt Readings from Last 3 Encounters:  02/14/18 242 lb 8.1 oz (110 kg)  01/20/18 240 lb (108.9 kg)  11/29/17 271 lb 12.8 oz (123.3 kg)    Physical Exam Patient remotely monitored.  Verbal communication appropriate.  Cognition normal.  Results for orders placed or performed in visit on 04/05/17  POCT Influenza A/B  Result Value Ref Range   Influenza A, POC Negative Negative   Influenza B, POC Negative Negative      Assessment & Plan:   Problem List Items Addressed This Visit    None    Visit Diagnoses    Reactive airway disease without complication, unspecified asthma severity, unspecified whether persistent    -  Primary   Relevant Orders   Ambulatory referral to Cardiology   Shortness of breath       Relevant Medications   albuterol (PROVENTIL HFA;VENTOLIN HFA) 108 (90 Base) MCG/ACT inhaler   Other Relevant Orders   Ambulatory referral to Cardiology    Likely patient has uncontrolled asthma or COPD as cause of shortness of breath.  Safest care is to complete cardiology workup and perform PFT/spirometry.  Patient declines PFT at this time.  Prefers to complete cardiac workup.  Patient is not exhibiting signs of CHF, but this is causing anxiety for patient due to family member with history of CHF.  Symptoms  chronic and are not likely related to Covid-19 with absence of fever and other expected symptoms.  Plan: - Referral cardiology placed for evaluation.  Encouraged patient to apply for patient financial assistance as medicaid family planning will not cover this eval. -  Continue albuterol inhaler - Start medication management application so he has easier access to inhalers.  START generic Advair 1 puff twice daily. - Follow-up prn and in 3 months.   Meds ordered this encounter  Medications  . albuterol (PROVENTIL HFA;VENTOLIN HFA) 108 (90 Base) MCG/ACT inhaler    Sig: Inhale 1-2 puffs into the lungs every 6 (six) hours as needed  for wheezing or shortness of breath.    Dispense:  1 Inhaler    Refill:  5    Product selection permitted for pt/insurance brand preference.    Order Specific Question:   Supervising Provider    Answer:   Smitty Cords [[id number]]  . Fluticasone-Salmeterol (ADVAIR) 100-50 MCG/DOSE AEPB    Sig: Inhale 1 puff into the lungs 2 (two) times daily.    Dispense:  1 each    Refill:  3    Order Specific Question:   Supervising Provider    Answer:   Smitty Cords [[id number]]    Disclosed to patient at start of encounter that we will bill telephone services and he will receive bill of services provided.  Patient consents to be treated via phone prior to discussion. - Patient is at his home and is accessed via telephone. - Services are provided from [facility]. - Time spent in direct consultation with patient on phone: 20 minutes  Follow up plan: Follow-up prn and 3 months if not improving.  [name], DNP, AGPCNP-BC Adult Gerontology Primary Care Nurse Practitioner [facility] 04/20/2018, 3:03 PM

## 2018-04-25 ENCOUNTER — Encounter: Payer: Self-pay | Admitting: Nurse Practitioner

## 2018-04-25 MED ORDER — FLUTICASONE-SALMETEROL 100-50 MCG/DOSE IN AEPB: 1.0000 | INHALATION_SPRAY | Freq: Two times a day (BID) | RESPIRATORY_TRACT | 3 refills | Status: DC

## 2018-05-17 ENCOUNTER — Other Ambulatory Visit: Payer: Self-pay | Admitting: Nurse Practitioner

## 2018-05-17 DIAGNOSIS — I10 Essential (primary) hypertension: Secondary | ICD-10-CM

## 2018-06-05 DIAGNOSIS — J449 Chronic obstructive pulmonary disease, unspecified: Secondary | ICD-10-CM | POA: Insufficient documentation

## 2018-06-05 DIAGNOSIS — R0602 Shortness of breath: Secondary | ICD-10-CM | POA: Insufficient documentation

## 2018-06-05 NOTE — Progress Notes (Signed)
Virtual Visit via Video Note   This visit type was conducted due to national recommendations for restrictions regarding the COVID-19 Pandemic (e.g. social distancing) in an effort to limit this patient's exposure and mitigate transmission in our community.  Due to his co-morbid illnesses, this patient is at least at moderate risk for complications without adequate follow up.  This format is felt to be most appropriate for this patient at this time.  All issues noted in this document were discussed and addressed.  A limited physical exam was performed with this format.  Please refer to the patient's chart for his consent to telehealth for [facility].   I connected with  Scott Campos on 06/06/18 by a video enabled telemedicine application and verified that I am speaking with the correct person using two identifiers. I discussed the limitations of evaluation and management by telemedicine. The patient expressed understanding and agreed to proceed.   Evaluation Performed:  Follow-up visit  Date:  06/06/2018   ID:  Scott Campos, DOB September 13, 1970, MRN [id number]  Patient Location:  [address] [place] [address]   Provider location:   [facility], [place] office  PCP:  Galen Manila, NP  Cardiologist:  [facility]   Chief Complaint:  SOB, chest pain   History of Present Illness:    Scott Campos is a 48 y.o. male who presents via audio/video conferencing for a telehealth visit today.   The patient does not symptoms concerning for COVID-19 infection (fever, chills, cough, or new SHORTNESS OF BREATH).   Patient has a past medical history of SOB, chronic Smoker, <1/2 ppd Morbid obesity Essential hypertension Referred by  [name] for consultation of his DOE on Exertion    shortness of breath with minimal exertion with one flight of stairs.  Walking sort distances creates shortness of breath and weakness as well.     Initially thought he was out of shape after time off, but shortness of breath is not improving at all.  Usually occurs only with activity.  Was having some severe chest pain during cold weather last year with shortness of breath.  uncontrolled asthma or COPD as cause of shortness of breath.  Safest care is to complete cardiology workup and perform PFT/spirometry.  Patient declines PFT at this time.  Prefers to complete cardiac workup.  Patient is not exhibiting signs of CHF, but this is causing anxiety for patient due to family member with history of CHF.     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 Bipolar 1 disorder (HCC)   . Cleft palate and lip   . Malachi Carl infection   . Hypertension   . Right knee injury    Past Surgical History:  Procedure Laterality Date  . CLEFT PALATE REPAIR       Current Meds  Medication Sig  . albuterol (PROVENTIL HFA;VENTOLIN HFA) 108 (90 Base) MCG/ACT inhaler Inhale 1-2 puffs into the lungs every 6 (six) hours as needed for wheezing or shortness of breath.  . Fluticasone-Salmeterol (  ADVAIR) 100-50 MCG/DOSE AEPB Inhale 1 puff into the lungs 2 (two) times daily.  . gabapentin (NEURONTIN) 100 MG capsule TAKE 1 CAPSULE BY MOUTH 3 TIMEMarland KitchenS DAILY  . hydrochlorothiazide (HYDRODIURIL) 25 MG tablet TAKE 1 TABLET BY MOUTH ONCE DAILY  . ibuprofen (ADVIL,MOTRIN) 200 MG tablet Take 200 mg by mouth every 6 (six) hours as needed.  Marland Kitchen. ipratropium (ATROVENT) 0.06 % nasal spray Place 2 sprays into both nostrils 4 (four) times daily.  Marland Kitchen. lisinopril (PRINIVIL,ZESTRIL) 20 MG tablet TAKE 1 TABLET BY MOUTH ONCE DAILY  .  Multiple Vitamin (MULTI VITAMIN DAILY PO) Take by mouth.  . naproxen (NAPROSYN) 500 MG tablet Take 1 tablet (500 mg total) by mouth 2 (two) times daily with a meal.  . tamsulosin (FLOMAX) 0.4 MG CAPS capsule Take 1 capsule (0.4 mg total) by mouth daily.   Current Facility-Administered Medications for the 06/06/18 encounter (Appointment) with Antonieta IbaGollan, Timothy J, MD  Medication  . ipratropium-albuterol (DUONEB) 0.5-2.5 (3) MG/3ML nebulizer solution 3 mL     Allergies:   Darvon [propoxyphene]   Social History   Tobacco Use  . Smoking status: Current Every Day Smoker    Packs/day: 0.25    Years: 15.00    Pack years: 3.75    Types: Cigarettes  . Smokeless tobacco: Never Used  Substance Use Topics  . Alcohol use: No    Frequency: Never  . Drug use: No     Current Outpatient Medications on File Prior to Visit  Medication Sig Dispense Refill  . albuterol (PROVENTIL HFA;VENTOLIN HFA) 108 (90 Base) MCG/ACT inhaler Inhale 1-2 puffs into the lungs every 6 (six) hours as needed for wheezing or shortness of breath. 1 Inhaler 5  . Fluticasone-Salmeterol (ADVAIR) 100-50 MCG/DOSE AEPB Inhale 1 puff into the lungs 2 (two) times daily. 1 each 3  . gabapentin (NEURONTIN) 100 MG capsule TAKE 1 CAPSULE BY MOUTH 3 TIMES DAILY 90 capsule 0  . hydrochlorothiazide (HYDRODIURIL) 25 MG tablet TAKE 1 TABLET BY MOUTH ONCE DAILY 90 tablet 0  . ibuprofen (ADVIL,MOTRIN) 200 MG tablet Take 200 mg by mouth every 6 (six) hours as needed.    Marland Kitchen. ipratropium (ATROVENT) 0.06 % nasal spray Place 2 sprays into both nostrils 4 (four) times daily. 15 mL 0  . lisinopril (PRINIVIL,ZESTRIL) 20 MG tablet TAKE 1 TABLET BY MOUTH ONCE DAILY 90 tablet 1  . Multiple Vitamin (MULTI VITAMIN DAILY PO) Take by mouth.    . naproxen (NAPROSYN) 500 MG tablet Take 1 tablet (500 mg total) by mouth 2 (two) times daily with a meal. 20 tablet 2  . tamsulosin (FLOMAX) 0.4 MG CAPS capsule Take 1 capsule (0.4 mg total) by mouth daily. 30 capsule 2    Current Facility-Administered Medications on File Prior to Visit  Medication Dose Route Frequency Provider Last Rate Last Dose  . ipratropium-albuterol (DUONEB) 0.5-2.5 (3) MG/3ML nebulizer solution 3 mL  3 mL Nebulization Q6H Galen ManilaKennedy, Lauren Renee, NP         Family Hx: The patient's family history includes Diabetes in his father; Hypertension in his father; Leukemia in his mother.  ROS:   Please see the history of present illness.    Review of Systems  Constitutional: Negative.   Respiratory: Positive for shortness of breath.   Cardiovascular: Positive for chest pain.  Gastrointestinal: Negative.   Musculoskeletal: Negative.   Neurological: Negative.   Psychiatric/Behavioral: Negative.   All other systems reviewed and are negative.     Labs/Other Tests and Data Reviewed:  Recent Labs: No results found for requested labs within last 8760 hours.   Recent Lipid Panel Lab Results  Component Value Date/Time   CHOL 166 03/18/2017 11:42 AM   TRIG 135 03/18/2017 11:42 AM   HDL 45 03/18/2017 11:42 AM   CHOLHDL 3.7 03/18/2017 11:42 AM   LDLCALC 98 03/18/2017 11:42 AM    Wt Readings from Last 3 Encounters:  02/14/18 242 lb 8.1 oz (110 kg)  01/20/18 240 lb (108.9 kg)  11/29/17 271 lb 12.8 oz (123.3 kg)     Exam:    Vital Signs: Vital signs may also be detailed in the HPI There were no vitals taken for this visit.  Wt Readings from Last 3 Encounters:  02/14/18 242 lb 8.1 oz (110 kg)  01/20/18 240 lb (108.9 kg)  11/29/17 271 lb 12.8 oz (123.3 kg)   Temp Readings from Last 3 Encounters:  01/20/18 97.8 F (36.6 C) (Oral)  11/29/17 98.2 F (36.8 C) (Oral)  07/11/17 98.3 F (36.8 C) (Oral)   BP Readings from Last 3 Encounters:  01/20/18 130/86  11/29/17 (!) 143/83  07/11/17 129/83   Pulse Readings from Last 3 Encounters:  01/20/18 89  11/29/17 96  07/11/17 97    130/80 Pulse 90 Respirations 16  Well nourished, well developed male in no acute distress.   Morbidly obese Constitutional:  oriented to person, place, and time. No distress.  Head: Normocephalic and atraumatic.  Eyes:  no discharge. No scleral icterus.  Neck: Normal range of motion. Neck supple.  Pulmonary/Chest: No audible wheezing, no distress, appears comfortable Musculoskeletal: Normal range of motion.  no  tenderness or deformity.  Neurological:   Coordination normal. Full exam not performed Skin:  No rash Psychiatric:  normal mood and affect. behavior is normal. Thought content normal.    ASSESSMENT & PLAN:    Chronic obstructive pulmonary disease, unspecified COPD type (HCC) Etiology of his shortness of breath likely secondary to smoking, COPD, obesity hypoventilation Some symptoms when going out into cold weather consistent with asthma Recommended weight loss, lifestyle modification Will need a sleep study Smoking cessation recommended  SOB (shortness of breath) Likely exacerbated by morbid obesity, deconditioning, smoking  Chest pain of uncertain etiology Atypical symptoms presenting at rest, sometimes in the morning waking him up raising concern of sleep apnea Recommend sleep study, will discuss with primary care High risk given morbid obesity Echocardiogram has been ordered to rule out structural heart disease Images will be difficult secondary to morbid obesity.  We will grossly look for wall motion abnormality Not a good candidate for stress testing given morbid obesity  COVID-19 Education: The signs and symptoms of COVID-19 were discussed with the patient and how to seek care for testing (follow up with PCP or arrange E-visit).  The importance of social distancing was discussed today.  Patient Risk:   After full review of this patients clinical status, I feel that they are at least moderate risk at this time.  Time:   Today, I have spent 45 minutes with the patient with telehealth technology discussing the cardiac and medical problems/diagnoses detailed  above   10 min spent reviewing the chart prior to patient visit today   Medication Adjustments/Labs and Tests Ordered: Current medicines are reviewed at length with the patient today.  Concerns regarding medicines are outlined above.   Tests Ordered: No tests ordered   Medication Changes: No changes made   Disposition: We will call him with the results of his echocardiogram  Signed, Julien Nordmann, MD  06/06/2018 1:36 PM    [facility] [address], [place], [place] [address]

## 2018-06-06 ENCOUNTER — Telehealth (INDEPENDENT_AMBULATORY_CARE_PROVIDER_SITE_OTHER): Payer: Medicaid Other | Admitting: Cardiovascular Disease

## 2018-06-06 ENCOUNTER — Other Ambulatory Visit: Payer: Self-pay

## 2018-06-06 ENCOUNTER — Telehealth: Payer: Self-pay

## 2018-06-06 ENCOUNTER — Telehealth: Payer: Self-pay | Admitting: Cardiovascular Disease

## 2018-06-06 DIAGNOSIS — R079 Chest pain, unspecified: Secondary | ICD-10-CM

## 2018-06-06 DIAGNOSIS — R0789 Other chest pain: Secondary | ICD-10-CM

## 2018-06-06 DIAGNOSIS — R0602 Shortness of breath: Secondary | ICD-10-CM

## 2018-06-06 DIAGNOSIS — J449 Chronic obstructive pulmonary disease, unspecified: Secondary | ICD-10-CM

## 2018-06-06 DIAGNOSIS — I10 Essential (primary) hypertension: Secondary | ICD-10-CM

## 2018-06-06 NOTE — Telephone Encounter (Signed)
Virtual Visit Pre-Appointment Phone Call  "(Name), I am calling you today to discuss your upcoming appointment. We are currently trying to limit exposure to the virus that causes COVID-19 by seeing patients at home rather than in the office."  1. "What is the BEST phone number to call the day of the visit?" - include this in appointment notes  2. Do you have or have access to (through a family member/friend) a smartphone with video capability that we can use for your visit?" a. If yes - list this number in appt notes as cell (if different from BEST phone #) and list the appointment type as a VIDEO visit in appointment notes b. If no - list the appointment type as a PHONE visit in appointment notes  3. Confirm consent - "In the setting of the current Covid19 crisis, you are scheduled for a (phone or video) visit with your provider on (date) at (time).  Just as we do with many in-office visits, in order for you to participate in this visit, we must obtain consent.  If you'd like, I can send this to your mychart (if signed up) or email for you to review.  Otherwise, I can obtain your verbal consent now.  All virtual visits are billed to your insurance company just like a normal visit would be.  By agreeing to a virtual visit, we'd like you to understand that the technology does not allow for your provider to perform an examination, and thus may limit your provider's ability to fully assess your condition. If your provider identifies any concerns that need to be evaluated in person, we will make arrangements to do so.  Finally, though the technology is pretty good, we cannot assure that it will always work on either your or our end, and in the setting of a video visit, we may have to convert it to a phone-only visit.  In either situation, we cannot ensure that we have a secure connection.  Are you willing to proceed?" STAFF: Did the patient verbally acknowledge consent to telehealth visit? Document  YES/NO here: YES   4. Advise patient to be prepared - "Two hours prior to your appointment, go ahead and check your blood pressure, pulse, oxygen saturation, and your weight (if you have the equipment to check those) and write them all down. When your visit starts, your provider will ask you for this information. If you have an Apple Watch or Kardia device, please plan to have heart rate information ready on the day of your appointment. Please have a pen and paper handy nearby the day of the visit as well."  5. Give patient instructions for MyChart download to smartphone OR [email or web address] as below if video visit (depending on what platform provider is using)  6. Inform patient they will receive a phone call 15 minutes prior to their appointment time (may be from unknown caller ID) so they should be prepared to answer    TELEPHONE CALL NOTE  Scott Campos Kohut has been deemed a candidate for a follow-up tele-health visit to limit community exposure during the Covid-19 pandemic. I spoke with the patient via phone to ensure availability of phone/video source, confirm preferred email & phone number, and discuss instructions and expectations.  I reminded Scott Campos Wehrli to be prepared with any vital sign and/or heart rhythm information that could potentially be obtained via home monitoring, at the time of his visit. I reminded Scott Campos Keplinger to expect a phone call prior  to his visit.  Scott Campos 06/06/2018 1:14 PM   INSTRUCTIONS FOR DOWNLOADING THE MYCHART APP TO SMARTPHONE  - The patient must first make sure to have activated MyChart and know their login information - If Apple, go to [employer] and type in MyChart in the search bar and download the app. If Android, ask patient to go to [employer] and type in [place] in the search bar and download the app. The app is free but as with any other app downloads, their phone may require them to verify saved payment information  or Apple/Android password.  - The patient will need to then log into the app with their MyChart username and password, and select [place] as their healthcare provider to link the account. When it is time for your visit, go to the MyChart app, find appointments, and click Begin Video Visit. Be sure to Select Allow for your device to access the Microphone and Camera for your visit. You will then be connected, and your provider will be with you shortly.  **If they have any issues connecting, or need assistance please contact MyChart service desk [phone])**  **If using a computer, in order to ensure the best quality for their visit they will need to use either of the following Internet Browsers: [employer], or Google Chrome**  IF USING DOXIMITY or DOXY.ME - The patient will receive a link just prior to their visit by text.     FULL LENGTH CONSENT FOR TELE-HEALTH VISIT   I hereby voluntarily request, consent and authorize [facility] and its employed or contracted physicians, physician assistants, nurse practitioners or other licensed health care professionals (the Practitioner), to provide me with telemedicine health care services (the Services") as deemed necessary by the treating Practitioner. I acknowledge and consent to receive the Services by the Practitioner via telemedicine. I understand that the telemedicine visit will involve communicating with the Practitioner through live audiovisual communication technology and the disclosure of certain medical information by electronic transmission. I acknowledge that I have been given the opportunity to request an in-person assessment or other available alternative prior to the telemedicine visit and am voluntarily participating in the telemedicine visit.  I understand that I have the right to withhold or withdraw my consent to the use of telemedicine in the course of my care at any time, without affecting my right to future  care or treatment, and that the Practitioner or I may terminate the telemedicine visit at any time. I understand that I have the right to inspect all information obtained and/or recorded in the course of the telemedicine visit and may receive copies of available information for a reasonable fee.  I understand that some of the potential risks of receiving the Services via telemedicine include:   Delay or interruption in medical evaluation due to technological equipment failure or disruption;  Information transmitted may not be sufficient (e.g. poor resolution of images) to allow for appropriate medical decision making by the Practitioner; and/or   In rare instances, security protocols could fail, causing a breach of personal health information.  Furthermore, I acknowledge that it is my responsibility to provide information about my medical history, conditions and care that is complete and accurate to the best of my ability. I acknowledge that Practitioner's advice, recommendations, and/or decision may be based on factors not within their control, such as incomplete or inaccurate data provided by me or distortions of diagnostic images or specimens that may result from electronic transmissions. I  understand that the practice of medicine is not an exact science and that Practitioner makes no warranties or guarantees regarding treatment outcomes. I acknowledge that I will receive a copy of this consent concurrently upon execution via email to the email address I last provided but may also request a printed copy by calling the office of [place].    I understand that my insurance will be billed for this visit.   I have read or had this consent read to me.  I understand the contents of this consent, which adequately explains the benefits and risks of the Services being provided via telemedicine.   I have been provided ample opportunity to ask questions regarding this consent and the Services and have  had my questions answered to my satisfaction.  I give my informed consent for the services to be provided through the use of telemedicine in my medical care  By participating in this telemedicine visit I agree to the above.

## 2018-06-06 NOTE — Telephone Encounter (Signed)
Called patient.  No answer. LMOV. Need to obtain consent.

## 2018-06-06 NOTE — Patient Instructions (Signed)
Medication Instructions:  No changes  If you need a refill on your cardiac medications before your next appointment, please call your pharmacy.    Lab work: No new labs needed   If you have labs (blood work) drawn today and your tests are completely normal, you will receive your results only by: Marland Kitchen MyChart Message (if you have MyChart) OR . A paper copy in the mail If you have any lab test that is abnormal or we need to change your treatment, we will call you to review the results.   Testing/Procedures: No new testing needed   Follow-Up: At [facility], you and your health needs are our priority.  As part of our continuing mission to provide you with exceptional heart care, we have created designated Provider Care Teams.  These Care Teams include your primary Cardiologist (physician) and Advanced Practice Providers (APPs -  Physician Assistants and Nurse Practitioners) who all work together to provide you with the care you need, when you need it.  . You will need a follow up appointment as needed We will call you with echocardiogram results We have sent message to primary care to discuss need for sleep study  . Providers on your designated Care Team:   . Nicolasa Ducking, NP . Eula Listen, PA-C . Marisue Ivan, PA-C  Any Other Special Instructions Will Be Listed Below (If Applicable).  For educational health videos Log in to : [email or web address] Or : [email or web address], password : [email or web address]

## 2018-06-13 ENCOUNTER — Telehealth: Payer: Self-pay | Admitting: Cardiovascular Disease

## 2018-06-13 ENCOUNTER — Other Ambulatory Visit: Payer: Self-pay | Admitting: *Deleted

## 2018-06-13 DIAGNOSIS — R0602 Shortness of breath: Secondary | ICD-10-CM

## 2018-06-13 DIAGNOSIS — R079 Chest pain, unspecified: Secondary | ICD-10-CM

## 2018-06-13 NOTE — Telephone Encounter (Signed)
I reviewed the patient's chart. Per Dr. Windell Hummingbird e-visit note dated 06/06/18,  Chest pain of uncertain etiology Atypical symptoms presenting at rest, sometimes in the morning waking him up raising concern of sleep apnea Recommend sleep study, will discuss with primary care High risk given morbid obesity Echocardiogram has been ordered to rule out structural heart disease Images will be difficult secondary to morbid obesity.  We will grossly look for wall motion abnormality Not a good candidate for stress testing given morbid obesity   Echo order has been placed.  Note routed to scheduling to please contact the patient to arrange.

## 2018-06-13 NOTE — Telephone Encounter (Signed)
Patient calling to schedule echo no order in epic please advise

## 2018-06-14 NOTE — Telephone Encounter (Signed)
lmov to schedule echo  °

## 2018-06-27 ENCOUNTER — Other Ambulatory Visit: Payer: Self-pay | Admitting: Nurse Practitioner

## 2018-06-27 DIAGNOSIS — N401 Enlarged prostate with lower urinary tract symptoms: Secondary | ICD-10-CM

## 2018-07-10 NOTE — Telephone Encounter (Signed)
Scheduled

## 2018-07-18 ENCOUNTER — Telehealth: Payer: Self-pay

## 2018-07-18 NOTE — Telephone Encounter (Signed)
    COVID-19 Pre-Screening Questions:  . In the past 7 to 10 days have you had a cough, shortness of breath, headache, congestion, fever (100 or greater), body aches, chills, sore throat, or sudden loss of taste or sense of smell? NO . Have you been around anyone with known Covid 19? NO . Have you been around anyone who is awaiting Covid 19 test results in the past 7 to 10 days? NO . Have you been around anyone who has been exposed to Covid 19, or has mentioned symptoms of Covid 19 within the past 7 to 10 days? NO  If you have any concerns/questions about symptoms patients report during screening (either on the phone or at threshold). Contact the provider seeing the patient or DOD for further guidance.  If neither are available contact a member of the leadership team.            

## 2018-07-19 ENCOUNTER — Ambulatory Visit (INDEPENDENT_AMBULATORY_CARE_PROVIDER_SITE_OTHER): Payer: Self-pay

## 2018-07-19 ENCOUNTER — Other Ambulatory Visit: Payer: Self-pay

## 2018-07-19 DIAGNOSIS — R0602 Shortness of breath: Secondary | ICD-10-CM

## 2018-07-19 DIAGNOSIS — R079 Chest pain, unspecified: Secondary | ICD-10-CM

## 2018-07-19 DIAGNOSIS — R0789 Other chest pain: Secondary | ICD-10-CM

## 2018-07-25 ENCOUNTER — Telehealth: Payer: Self-pay | Admitting: Cardiovascular Disease

## 2018-07-25 NOTE — Telephone Encounter (Signed)
Follow up   Patient is returning call for results per the previous message. Please call.

## 2018-07-25 NOTE — Telephone Encounter (Signed)
Patient would like echo results.

## 2018-07-25 NOTE — Telephone Encounter (Signed)
Incoming call from ptaient. Reviewed echo results. No new orders.   Pt reports that he has not gone for sleep study yet.  Referred to [place] pt address and location so he can call to set up appt.   Advised pt to call for any further questions or concerns. No further questions at this time.

## 2018-07-25 NOTE — Telephone Encounter (Signed)
Left the pt a message to call the [facility] office back, for further assistance with getting his echo results.

## 2018-07-31 ENCOUNTER — Telehealth: Payer: Self-pay | Admitting: *Deleted

## 2018-07-31 NOTE — Telephone Encounter (Signed)
-----   Message from Minna Merritts, MD sent at 07/23/2018 10:19 AM EDT ----- Echocardiogram Normal ejection fraction 60 to 65% No significant valve disease  would ask him if he has been referred for sleep study yet?

## 2018-07-31 NOTE — Telephone Encounter (Signed)
Patient calling to discuss recent echo testing results  ° °Please call  ° °

## 2018-07-31 NOTE — Telephone Encounter (Signed)
Left voicemail message to call back for results.  

## 2018-07-31 NOTE — Telephone Encounter (Signed)
Pt aware of echo results and the recommendations to have sleep study appears message was sent to PMD to make these arrangements Pt will call PMD to see about getting sleep study done ./cy

## 2018-09-20 ENCOUNTER — Telehealth: Payer: Self-pay | Admitting: Nurse Practitioner

## 2018-09-20 NOTE — Telephone Encounter (Signed)
The pt was notified and scheduled for Friday, Sept 4th at 11:00am.

## 2018-09-20 NOTE — Telephone Encounter (Signed)
Pt  Called requesting something to be called in for Acid reflux

## 2018-09-20 NOTE — Telephone Encounter (Signed)
Patient has never been treated by [place] for reflux before.  He will need to be seen.

## 2018-09-22 ENCOUNTER — Ambulatory Visit: Payer: Medicaid Other | Admitting: Nurse Practitioner

## 2018-09-22 ENCOUNTER — Other Ambulatory Visit: Payer: Self-pay

## 2019-02-18 ENCOUNTER — Other Ambulatory Visit: Payer: Self-pay | Admitting: Nurse Practitioner

## 2019-02-18 DIAGNOSIS — R0602 Shortness of breath: Secondary | ICD-10-CM

## 2019-02-27 ENCOUNTER — Other Ambulatory Visit: Payer: Self-pay | Admitting: Family Medicine

## 2019-02-27 ENCOUNTER — Other Ambulatory Visit: Payer: Self-pay | Admitting: Nurse Practitioner

## 2019-02-27 DIAGNOSIS — I10 Essential (primary) hypertension: Secondary | ICD-10-CM

## 2019-03-29 ENCOUNTER — Ambulatory Visit: Payer: Medicaid Other | Admitting: Family Medicine

## 2019-05-08 ENCOUNTER — Other Ambulatory Visit: Payer: Self-pay | Admitting: Family Medicine

## 2019-05-08 DIAGNOSIS — I10 Essential (primary) hypertension: Secondary | ICD-10-CM

## 2019-05-08 NOTE — Telephone Encounter (Signed)
I called and left a message on the patient personal vm that he need to schedule a appt in order to continue getting medication refills.

## 2019-05-08 NOTE — Telephone Encounter (Signed)
Pt > 3 months overdue for appt and has no future visit scheduled. Called pt and LM on Vm to call office and make appt. Call back number provided. Last RF: 02/27/19 RF due: yes Future visit scheduled: no Active med list: yes Requested Prescriptions  Pending Prescriptions Disp Refills  . lisinopril (ZESTRIL) 20 MG tablet [Pharmacy Med Name: LISINOPRIL 20 MG TAB] 90 tablet 0    Sig: TAKE 1 TABLET BY MOUTH ONCE DAILY     Cardiovascular:  ACE Inhibitors Failed - 05/08/2019 12:05 PM      Failed - Cr in normal range and within 180 days    Creat  Date Value Ref Range Status  03/18/2017 0.88 0.60 - 1.35 mg/dL Final         Failed - K in normal range and within 180 days    Potassium  Date Value Ref Range Status  03/18/2017 4.1 3.5 - 5.3 mmol/L Final         Failed - Valid encounter within last 6 months    Recent Outpatient Visits          1 year ago Reactive airway disease without complication, unspecified asthma severity, unspecified whether persistent   [facility] Galen Manila, NP   1 year ago Dyspnea on exertion   [facility] Kyung Rudd, Alison Stalling, NP   1 year ago Herpes zoster virus infection of face and ear nerves   [facility] Galen Manila, NP   2 years ago Flu-like symptoms   [facility] Kyung Rudd, Alison Stalling, NP   2 years ago Essential hypertension   [facility] Galen Manila, NP             Passed - Patient is not pregnant      Passed - Last BP in normal range    BP Readings from Last 1 Encounters:  01/20/18 130/86

## 2019-08-02 ENCOUNTER — Other Ambulatory Visit: Payer: Self-pay | Admitting: Family Medicine

## 2019-08-02 DIAGNOSIS — N401 Enlarged prostate with lower urinary tract symptoms: Secondary | ICD-10-CM

## 2019-08-02 DIAGNOSIS — I10 Essential (primary) hypertension: Secondary | ICD-10-CM

## 2019-08-02 NOTE — Telephone Encounter (Signed)
Requested medication (s) are due for refill today - yes  Requested medication (s) are on the active medication list -yes  Future visit scheduled -no  Last refill: tamsulosin 06/26/19                 Lisinopril  05/08/19  Notes to clinic: Attempted to call patient to schedule appointment- several attempts have been made over the last few months- left message to call office for appointment- request sent for review  Requested Prescriptions  Pending Prescriptions Disp Refills   tamsulosin (FLOMAX) 0.4 MG CAPS capsule [Pharmacy Med Name: TAMSULOSIN HCL 0.4 MG CAP] 90 capsule 1    Sig: TAKE 1 CAPSULE BY MOUTH ONCE DAILY      Urology: Alpha-Adrenergic Blocker Failed - 08/02/2019  1:33 PM      Failed - Valid encounter within last 12 months    Recent Outpatient Visits           1 year ago Reactive airway disease without complication, unspecified asthma severity, unspecified whether persistent   [facility] Kyung Rudd, Alison Stalling, NP   1 year ago Dyspnea on exertion   [facility] Kyung Rudd, Alison Stalling, NP   2 years ago Herpes zoster virus infection of face and ear nerves   [facility] Galen Manila, NP   2 years ago Flu-like symptoms   [facility] Kyung Rudd, Alison Stalling, NP   2 years ago Essential hypertension   [facility] Kyung Rudd, Alison Stalling, NP              Passed - Last BP in normal range    BP Readings from Last 1 Encounters:  01/20/18 130/86            lisinopril (ZESTRIL) 20 MG tablet [Pharmacy Med Name: LISINOPRIL 20 MG TAB] 90 tablet 0    Sig: TAKE 1 TABLET BY MOUTH ONCE DAILY      Cardiovascular:  ACE Inhibitors Failed - 08/02/2019  1:33 PM      Failed - Cr in normal range and within 180 days    Creat  Date Value Ref Range Status  03/18/2017 0.88 0.60 - 1.35 mg/dL Final          Failed - K in normal range and within 180 days    Potassium  Date Value Ref Range Status   03/18/2017 4.1 3.5 - 5.3 mmol/L Final          Failed - Valid encounter within last 6 months    Recent Outpatient Visits           1 year ago Reactive airway disease without complication, unspecified asthma severity, unspecified whether persistent   [facility] Galen Manila, NP   1 year ago Dyspnea on exertion   [facility] Kyung Rudd, Alison Stalling, NP   2 years ago Herpes zoster virus infection of face and ear nerves   [facility] Galen Manila, NP   2 years ago Flu-like symptoms   [facility] Kyung Rudd, Alison Stalling, NP   2 years ago Essential hypertension   [facility] Galen Manila, NP              Passed - Patient is not pregnant      Passed - Last BP in normal range    BP Readings from Last 1 Encounters:  01/20/18 130/86  Requested Prescriptions  Pending Prescriptions Disp Refills   tamsulosin (FLOMAX) 0.4 MG CAPS capsule [Pharmacy Med Name: TAMSULOSIN HCL 0.4 MG CAP] 90 capsule 1    Sig: TAKE 1 CAPSULE BY MOUTH ONCE DAILY      Urology: Alpha-Adrenergic Blocker Failed - 08/02/2019  1:33 PM      Failed - Valid encounter within last 12 months    Recent Outpatient Visits           1 year ago Reactive airway disease without complication, unspecified asthma severity, unspecified whether persistent   [facility] Kyung Rudd, Alison Stalling, NP   1 year ago Dyspnea on exertion   [facility] Kyung Rudd, Alison Stalling, NP   2 years ago Herpes zoster virus infection of face and ear nerves   [facility] Galen Manila, NP   2 years ago Flu-like symptoms   [facility] Kyung Rudd, Alison Stalling, NP   2 years ago Essential hypertension   [facility] Kyung Rudd, Alison Stalling, NP              Passed - Last BP in normal range    BP Readings from Last 1 Encounters:  01/20/18 130/86             lisinopril (ZESTRIL) 20 MG tablet [Pharmacy Med Name: LISINOPRIL 20 MG TAB] 90 tablet 0    Sig: TAKE 1 TABLET BY MOUTH ONCE DAILY      Cardiovascular:  ACE Inhibitors Failed - 08/02/2019  1:33 PM      Failed - Cr in normal range and within 180 days    Creat  Date Value Ref Range Status  03/18/2017 0.88 0.60 - 1.35 mg/dL Final          Failed - K in normal range and within 180 days    Potassium  Date Value Ref Range Status  03/18/2017 4.1 3.5 - 5.3 mmol/L Final          Failed - Valid encounter within last 6 months    Recent Outpatient Visits           1 year ago Reactive airway disease without complication, unspecified asthma severity, unspecified whether persistent   [facility] Galen Manila, NP   1 year ago Dyspnea on exertion   [facility] Kyung Rudd, Alison Stalling, NP   2 years ago Herpes zoster virus infection of face and ear nerves   [facility] Galen Manila, NP   2 years ago Flu-like symptoms   [facility] Kyung Rudd, Alison Stalling, NP   2 years ago Essential hypertension   [facility] Galen Manila, NP              Passed - Patient is not pregnant      Passed - Last BP in normal range    BP Readings from Last 1 Encounters:  01/20/18 130/86

## 2019-11-07 ENCOUNTER — Encounter: Payer: Medicaid Other | Admitting: Family Medicine

## 2019-11-12 ENCOUNTER — Other Ambulatory Visit: Payer: Self-pay

## 2019-11-12 ENCOUNTER — Encounter: Payer: Medicaid Other | Admitting: Family Medicine

## 2019-11-12 DIAGNOSIS — Z91199 Patient's noncompliance with other medical treatment and regimen due to unspecified reason: Secondary | ICD-10-CM

## 2019-11-12 DIAGNOSIS — Z5329 Procedure and treatment not carried out because of patient's decision for other reasons: Secondary | ICD-10-CM

## 2019-11-20 ENCOUNTER — Encounter: Payer: Self-pay | Admitting: Family Medicine

## 2019-11-20 ENCOUNTER — Other Ambulatory Visit: Payer: Self-pay

## 2019-11-20 ENCOUNTER — Ambulatory Visit (INDEPENDENT_AMBULATORY_CARE_PROVIDER_SITE_OTHER): Payer: Medicaid Other | Admitting: Family Medicine

## 2019-11-20 VITALS — BP 153/106 | HR 105 | Temp 98.2°F | Resp 18 | Ht 67.0 in | Wt 257.6 lb

## 2019-11-20 DIAGNOSIS — Z0000 Encounter for general adult medical examination without abnormal findings: Secondary | ICD-10-CM | POA: Diagnosis not present

## 2019-11-20 NOTE — Progress Notes (Signed)
Subjective:    Patient ID: Scott Campos, male    DOB: 1970/12/15, 49 y.o.   MRN: [id number]  Scott SaaChristopher R Tietze is a 49 y.o. male presenting on 11/20/2019 for Annual Exam   HPI  HEALTH MAINTENANCE:  Weight/BMI: Morbid obesity, BMI 40.35% Physical activity: Sedentary Diet: Regular Seatbelt: Yes Sunscreen: As needed Prostate exam/PSA: Due for this Colon cancer screening: Due for this Optometry: As needed Dentistry: As needed  IMMUNIZATIONS: Tetanus: Due, discussed Influenza: Due, discussed COVID: Due, discussed  [facility]  Decreased Interest 0  Down, Depressed, Hopeless 3  PHQ - 2 Score 3  Altered sleeping 0  Tired, decreased energy 3  Change in appetite 3  Feeling bad or failure about yourself  0  Trouble concentrating 0  Moving slowly or fidgety/restless 0  Suicidal thoughts 0  PHQ-9 Score 9  Difficult doing work/chores Not difficult at all    Past Medical History:  Diagnosis Date  . Bipolar 1 disorder (HCC)   . Cleft palate and lip   . Malachi CarlEpstein Barr infection   . Hypertension   . Right knee injury    Past Surgical History:  Procedure Laterality Date  . CLEFT PALATE REPAIR     Social History   Socioeconomic History  . Marital status: Divorced    Spouse name: Not on file  . Number of children: 0  . Years of education: Not on file  . Highest education level: Associate degree: occupational, Scientist, product/process developmenttechnical, or vocational program  Occupational History  . Occupation: Personnel officerelectrician  Tobacco Use  . Smoking status: Current Every Day Smoker    Packs/day: 0.25    Years: 15.00    Pack years: 3.75    Types: Cigarettes  . Smokeless tobacco: Never Used  Vaping Use  . Vaping Use: Never used  Substance and Sexual Activity  . Alcohol use: Yes    Alcohol/week: 12.0 standard drinks    Types: 12 Cans of beer per week  . Drug use: No  . Sexual activity: Not Currently  Other Topics Concern  . Not on file  Social History Narrative    . Not on file   Social Determinants of Health   Financial Resource Strain:   . Difficulty of Paying Living Expenses: Not on file  Food Insecurity:   . Worried About Programme researcher, broadcasting/film/videounning Out of Food in the Last Year: Not on file  . Ran Out of Food in the Last Year: Not on file  Transportation Needs:   . Lack of Transportation (Medical): Not on file  . Lack of Transportation (Non-Medical): Not on file  Physical Activity:   . Days of Exercise per Week: Not on file  . Minutes of Exercise per Session: Not on file  Stress:   . Feeling of Stress : Not on file  Social Connections:   . Frequency of Communication with Friends and Family: Not on file  . Frequency of Social Gatherings with Friends and Family: Not on file  . Attends Religious Services: Not on file  . Active Member of Clubs or Organizations: Not on file  . Attends BankerClub or Organization Meetings: Not on file  . Marital Status: Not on file  Intimate Partner Violence:   . Fear of Current or Ex-Partner: Not on file  . Emotionally Abused: Not on file  . Physically Abused: Not on file  . Sexually Abused: Not on file   Family History  Problem Relation Age of Onset  . Leukemia Mother   .  Diabetes Father   . Hypertension Father    Current Outpatient Medications on File Prior to Visit  Medication Sig  . ibuprofen (ADVIL,MOTRIN) 200 MG tablet Take 200 mg by mouth every 6 (six) hours as needed.  Marland Kitchen lisinopril (ZESTRIL) 20 MG tablet TAKE 1 TABLET BY MOUTH ONCE DAILY  . tamsulosin (FLOMAX) 0.4 MG CAPS capsule TAKE 1 CAPSULE BY MOUTH ONCE DAILY  . albuterol (PROVENTIL HFA;VENTOLIN HFA) 108 (90 Base) MCG/ACT inhaler Inhale 1-2 puffs into the lungs every 6 (six) hours as needed for wheezing or shortness of breath. (Patient not taking: Reported on 11/20/2019)  . Fluticasone-Salmeterol (ADVAIR) 100-50 MCG/DOSE AEPB Inhale 1 puff into the lungs 2 (two) times daily. (Patient not taking: Reported on 11/20/2019)  . gabapentin (NEURONTIN) 100 MG capsule TAKE  1 CAPSULE BY MOUTH 3 TIMES DAILY (Patient not taking: Reported on 11/20/2019)  . hydrochlorothiazide (HYDRODIURIL) 25 MG tablet TAKE 1 TABLET BY MOUTH ONCE DAILY (Patient not taking: Reported on 11/20/2019)  . ipratropium (ATROVENT) 0.06 % nasal spray Place 2 sprays into both nostrils 4 (four) times daily. (Patient not taking: Reported on 11/20/2019)  . Multiple Vitamin (MULTI VITAMIN DAILY PO) Take by mouth. (Patient not taking: Reported on 11/20/2019)  . naproxen (NAPROSYN) 500 MG tablet Take 1 tablet (500 mg total) by mouth 2 (two) times daily with a meal. (Patient not taking: Reported on 11/20/2019)   Current Facility-Administered Medications on File Prior to Visit  Medication  . ipratropium-albuterol (DUONEB) 0.5-2.5 (3) MG/3ML nebulizer solution 3 mL    Per HPI unless specifically indicated above     Objective:    BP (!) 153/106 (BP Location: Left Arm, Patient Position: Sitting, Cuff Size: Large)   Pulse (!) 105   Temp 98.2 F (36.8 C) (Oral)   Resp 18   Ht 5\' 7"  (1.702 m)   Wt 257 lb 9.6 oz (116.8 kg)   SpO2 98%   BMI 40.35 kg/m   Wt Readings from Last 3 Encounters:  11/20/19 257 lb 9.6 oz (116.8 kg)  02/14/18 242 lb 8.1 oz (110 kg)  01/20/18 240 lb (108.9 kg)    Physical Exam Vitals and nursing note reviewed.  Constitutional:      General: He is not in acute distress.    Appearance: Normal appearance. He is well-developed and well-groomed. He is morbidly obese. He is not ill-appearing or toxic-appearing.  HENT:     Head: Normocephalic and atraumatic.     Right Ear: Tympanic membrane, ear canal and external ear normal. There is no impacted cerumen.     Left Ear: Tympanic membrane, ear canal and external ear normal. There is no impacted cerumen.     Nose: Nose normal. No congestion or rhinorrhea.     Mouth/Throat:     Mouth: Mucous membranes are moist.     Pharynx: Oropharynx is clear. No oropharyngeal exudate or posterior oropharyngeal erythema.  Eyes:     General: Lids  are normal. Vision grossly intact. No scleral icterus.       Right eye: No discharge or hordeolum.        Left eye: No discharge or hordeolum.     Extraocular Movements: Extraocular movements intact.     Conjunctiva/sclera: Conjunctivae normal.     Pupils: Pupils are equal, round, and reactive to light.  Neck:     Thyroid: No thyroid mass, thyromegaly or thyroid tenderness.  Cardiovascular:     Rate and Rhythm: Normal rate and regular rhythm.     Pulses: Normal  pulses.          Dorsalis pedis pulses are 2+ on the right side and 2+ on the left side.     Heart sounds: Normal heart sounds. No murmur heard.  No friction rub. No gallop.   Pulmonary:     Effort: Pulmonary effort is normal. No respiratory distress.     Breath sounds: Normal breath sounds.  Abdominal:     General: Abdomen is flat. Bowel sounds are normal. There is no distension or abdominal bruit.     Palpations: Abdomen is soft. There is no hepatomegaly, splenomegaly or mass.     Tenderness: There is no abdominal tenderness. There is no guarding or rebound.     Hernia: No hernia is present.  Genitourinary:    Comments: Deferred Musculoskeletal:        General: Normal range of motion.     Cervical back: Normal range of motion and neck supple. No tenderness.     Right lower leg: No edema.     Left lower leg: No edema.     Comments: Normal tone, 5/5 strength BUE & BLE  Feet:     Right foot:     Skin integrity: Skin integrity normal.     Left foot:     Skin integrity: Skin integrity normal.  Lymphadenopathy:     Cervical: No cervical adenopathy.  Skin:    General: Skin is warm and dry.     Capillary Refill: Capillary refill takes less than 2 seconds.  Neurological:     General: No focal deficit present.     Mental Status: He is alert and oriented to person, place, and time.     Cranial Nerves: Cranial nerves are intact. No cranial nerve deficit.     Sensory: Sensation is intact. No sensory deficit.     Motor: Motor  function is intact. No weakness.     Coordination: Coordination is intact. Coordination normal.     Gait: Gait is intact. Gait normal.     Deep Tendon Reflexes: Reflexes normal.  Psychiatric:        Attention and Perception: Attention and perception normal.        Mood and Affect: Mood and affect normal.        Speech: Speech normal.        Behavior: Behavior normal. Behavior is cooperative.        Thought Content: Thought content normal.        Cognition and Memory: Cognition and memory normal.        Judgment: Judgment normal.     Results for orders placed or performed in visit on 04/05/17  POCT Influenza A/B  Result Value Ref Range   Influenza A, POC Negative Negative   Influenza B, POC Negative Negative      Assessment & Plan:   Problem List Items Addressed This Visit      Other   Annual physical exam - Primary    Annual physical exam with acute concerns.  Reviewed need for labs, colon cancer screening, PSA screening, management of chronic conditions.  Patient with family planning medicaid and declines self pay visit to address other concerns that are present.  [facility] as they assist patients without insurance to stay on top of preventative health care screenings as well management of chronic conditions.  Plan: 1. Obtain health maintenance screenings as above according to age.  Contact [facility] [place] to get established and have screenings/labs completed - Increase physical activity to 30 minutes most days of the week.  - Eat healthy diet high in vegetables and fruits; low in refined carbohydrates. - Screening labs and tests as ordered 2. Return 1 year for annual physical.          No orders of the defined types were placed in this encounter.   Follow up plan: Return in about 1 year (around 11/19/2020) for CPE.  Charlaine Dalton, FNP-C Family Nurse  Practitioner [facility] 11/20/2019, 1:56 PM

## 2019-11-20 NOTE — Patient Instructions (Addendum)
Contact [facility] [address] [place]. [address], [place], [place] [address] Phone # [phone] Fax # [phone]  They may be able to assist with your preventative health screenings, labs, medication refills and referrals that you need.  We do offer assistance with these in our clinic at a reduced rate for self pay patients if you decide you'd like to proceed with this.  Well Visit: Care Instructions Overview  Well visits can help you stay healthy. Your provider has checked your overall health and may have suggested ways to take good care of yourself. Your provider also may have recommended tests. At home, you can help prevent illness with healthy eating, regular exercise, and other steps.  Follow-up care is a key part of your treatment and safety. Be sure to make and go to all appointments, and call your provider if you are having problems. It's also a good idea to know your test results and keep a list of the medicines you take.  How can you care for yourself at home?   Get screening tests that you and your doctor decide on. Screening helps find diseases before any symptoms appear.   Eat healthy foods. Choose fruits, vegetables, whole grains, protein, and low-fat dairy foods. Limit fat, especially saturated fat. Reduce salt in your diet.   Limit alcohol. If you are a man, have no more than 2 drinks a day or 14 drinks a week. If you are a woman, have no more than 1 drink a day or 7 drinks a week.   Get at least 30 minutes of physical activity on most days of the week.  We recommend you go no more than 2 days in a row without exercise. Walking is a good choice. You also may want to do other activities, such as running, swimming, cycling, or playing tennis or team sports. Discuss any changes in your exercise program with your provider.   Reach and stay at a healthy weight. This will lower your risk for many problems, such as obesity, diabetes, heart  disease, and high blood pressure.   Do not smoke or allow others to smoke around you. If you need help quitting, talk to your provider about stop-smoking programs and medicines. These can increase your chances of quitting for good.  Can call 1-800-QUIT-NOW (9033497692) for the [facility], assistance with smoking cessation.   Care for your mental health. It is easy to get weighed down by worry and stress. Learn strategies to manage stress, like deep breathing and mindfulness, and stay connected with your family and community. If you find you often feel sad or hopeless, talk with your provider. Treatment can help.   Talk to your provider about whether you have any risk factors for sexually transmitted infections (STIs). You can help prevent STIs if you wait to have sex with a new partner (or partners) until you've each been tested for STIs. It also helps if you use condoms (male or male condoms) and if you limit your sex partners to one person who only has sex with you. Vaccines are available for some STIs, such as HPV (these are age dependent).   If you think you may have a problem with alcohol or drug use, talk to your provider. This includes prescription medicines (such as amphetamines and opioids) and illegal drugs (such as cocaine and methamphetamine). Your provider can help you figure out what type of treatment is best for you.   If you  have concerns about domestic violence or intimate partner violence, there are resources available to you. National Domestic Abuse Hotline 856-670-4403   Protect your skin from too much sun. When you're outdoors from 10 a.m. to 4 p.m., stay in the shade or cover up with clothing and a hat with a wide brim. Wear sunglasses that block UV rays. Even when it's cloudy, put broad-spectrum sunscreen (SPF 30 or higher) on any exposed skin.   See a dentist one or two times a year for checkups and to have your teeth cleaned.   See an eye doctor once  per year for an eye exam.   Wear a seat belt in the car.  When should you call for help?  Watch closely for changes in your health, and be sure to contact your provider if you have any problems or symptoms that concern you.  We will plan to see you back in 12 months for your physical  You will receive a survey after today's visit either digitally by e-mail or paper by USPS mail. Your experiences and feedback matter to [place].  Please respond so we know how we are doing as we provide care for you.  Call us with any questions/concerns/needs.  It is my goal to be available to you for your health concerns.  Thanks for choosing me to be a partner in your healthcare needs!  Charlaine Dalton, FNP-C Family Nurse Practitioner [facility] Phone: [phone]

## 2019-11-22 DIAGNOSIS — Z0000 Encounter for general adult medical examination without abnormal findings: Secondary | ICD-10-CM | POA: Insufficient documentation

## 2019-11-22 NOTE — Assessment & Plan Note (Signed)
Annual physical exam with acute concerns.  Reviewed need for labs, colon cancer screening, PSA screening, management of chronic conditions.  Patient with family planning medicaid and declines self pay visit to address other concerns that are present.  [facility] as they assist patients without insurance to stay on top of preventative health care screenings as well management of chronic conditions.  Plan: 1. Obtain health maintenance screenings as above according to age.  Contact [facility] in [place] to get established and have screenings/labs completed - Increase physical activity to 30 minutes most days of the week.  - Eat healthy diet high in vegetables and fruits; low in refined carbohydrates. - Screening labs and tests as ordered 2. Return 1 year for annual physical.

## 2019-12-04 ENCOUNTER — Telehealth: Payer: Self-pay

## 2019-12-04 NOTE — Telephone Encounter (Signed)
Social work intern left message for patient letting him know that she had received a message that he had been referred to the clinic by [place].  Asked patient to call the clinic and to ask how he may become a new patient and provided the clinic contact phone number.

## 2020-03-03 ENCOUNTER — Telehealth: Payer: Self-pay

## 2020-03-03 NOTE — Telephone Encounter (Signed)
Copied from CRM [id number]. Topic: General - Other >> Mar 03, 2020  9:41 AM Jaquita Rector A wrote: Reason for CRM: Patient called in to inquire if he can be seen today stated that he have a tooth that broke off and he is not sure if that is what caused him to wake up on 03/01/20 with his eye swollen shut still puffy this morning but can see out of it somewhat. Asking for a call back if he can be seen today or just with some instructions of what he can do. Please call Ph#  [phone]

## 2020-03-03 NOTE — Telephone Encounter (Signed)
I called and left a detail message on the patient personal vm.

## 2020-03-05 ENCOUNTER — Ambulatory Visit
Admission: EM | Admit: 2020-03-05 | Discharge: 2020-03-05 | Disposition: A | Discharge status: Home / Self Care | Payer: Medicaid Other | Attending: Emergency Medicine | Admitting: Emergency Medicine

## 2020-03-05 ENCOUNTER — Ambulatory Visit: Payer: Medicaid Other | Admitting: Family Medicine

## 2020-03-05 ENCOUNTER — Other Ambulatory Visit: Payer: Self-pay

## 2020-03-05 DIAGNOSIS — S025XXB Fracture of tooth (traumatic), initial encounter for open fracture: Secondary | ICD-10-CM

## 2020-03-05 DIAGNOSIS — K047 Periapical abscess without sinus: Secondary | ICD-10-CM

## 2020-03-05 MED ORDER — AMOXICILLIN-POT CLAVULANATE 875-125 MG PO TABS: 1.0000 | ORAL_TABLET | Freq: Two times a day (BID) | ORAL | 0 refills | Status: AC

## 2020-03-05 MED ORDER — HYDROCODONE-ACETAMINOPHEN 5-325 MG PO TABS: 2.0000 | ORAL_TABLET | ORAL | 0 refills | Status: DC | PRN

## 2020-03-05 NOTE — ED Triage Notes (Signed)
Patient complains of left upper tooth pain x 6-8 months. States that he broke it then and was placed on antibiotics and improved. Patient reports that he has been trying to see his primary office but unable to get in this week. Reports that pain has been worse and swollen x 1 week.

## 2020-03-05 NOTE — ED Provider Notes (Signed)
[facility]    CSN: [id number] Arrival date & time: 03/05/20  0923      History   Chief Complaint Chief Complaint  Patient presents with  . Dental Pain    HPI Scott Campos is a 50 y.o. male.   HPI   49 year old male here for evaluation of dental pain.  Patient reports that his pain is coming from a broken left upper molar that broke 6 to 8 months ago.  Patient has had one previous episode of pain and swelling that resolved with antibiotics.  Patient does not currently have a dentist, and has a history of a cleft palate, but was seen and treated by the faculty practice at [employer] school of dentistry as a child.  Patient states that his current pain and swelling have been going on for about a week.  Patient denies any drainage from around his tooth or fever.  Past Medical History:  Diagnosis Date  . Bipolar 1 disorder (HCC)   . Cleft palate and lip   . Malachi Carl infection   . Hypertension   . Right knee injury     Patient Active Problem List   Diagnosis Date Noted  . Annual physical exam 11/22/2019  . Chest pain of uncertain etiology 06/06/2018  . SOB (shortness of breath) 06/05/2018  . COPD (chronic obstructive pulmonary disease) (HCC) 06/05/2018  . Essential hypertension 02/15/2017    Past Surgical History:  Procedure Laterality Date  . CLEFT PALATE REPAIR         Home Medications    Prior to Admission medications   Medication Sig Start Date End Date Taking? Authorizing Provider  amoxicillin-clavulanate (AUGMENTIN) 875-125 MG tablet Take 1 tablet by mouth every 12 (twelve) hours for 10 days. 03/05/20 03/15/20 Yes Becky Augusta, NP  HYDROcodone-acetaminophen (NORCO/VICODIN) 5-325 MG tablet Take 2 tablets by mouth every 4 (four) hours as needed. 03/05/20  Yes Becky Augusta, NP  lisinopril (ZESTRIL) 20 MG tablet TAKE 1 TABLET BY MOUTH ONCE DAILY 02/27/19  Yes Karamalegos, Netta Neat, DO  Multiple Vitamin (MULTI VITAMIN DAILY PO) Take by  mouth.   Yes [provider]  tamsulosin (FLOMAX) 0.4 MG CAPS capsule TAKE 1 CAPSULE BY MOUTH ONCE DAILY 06/28/18  Yes Karamalegos, Netta Neat, DO  hydrochlorothiazide (HYDRODIURIL) 25 MG tablet TAKE 1 TABLET BY MOUTH ONCE DAILY 02/27/19   Althea Charon, Netta Neat, DO  albuterol (PROVENTIL HFA;VENTOLIN HFA) 108 (90 Base) MCG/ACT inhaler Inhale 1-2 puffs into the lungs every 6 (six) hours as needed for wheezing or shortness of breath. Patient not taking: No sig reported 04/20/18 03/05/20  Galen Manila, NP  Fluticasone-Salmeterol (ADVAIR) 100-50 MCG/DOSE AEPB Inhale 1 puff into the lungs 2 (two) times daily. Patient not taking: No sig reported 04/25/18 03/05/20  Galen Manila, NP  gabapentin (NEURONTIN) 100 MG capsule TAKE 1 CAPSULE BY MOUTH 3 TIMES DAILY Patient not taking: No sig reported 10/10/17 03/05/20  Galen Manila, NP  ipratropium (ATROVENT) 0.06 % nasal spray Place 2 sprays into both nostrils 4 (four) times daily. Patient not taking: No sig reported 04/05/17 03/05/20  Galen Manila, NP    Family History Family History  Problem Relation Age of Onset  . Leukemia Mother   . Diabetes Father   . Hypertension Father     Social History Social History   Tobacco Use  . Smoking status: Current Every Day Smoker    Packs/day: 0.25    Years: 15.00    Pack years:  3.75    Types: Cigarettes  . Smokeless tobacco: Never Used  Vaping Use  . Vaping Use: Never used  Substance Use Topics  . Alcohol use: Yes    Alcohol/week: 12.0 standard drinks    Types: 12 Cans of beer per week  . Drug use: No     Allergies   Darvon [propoxyphene]   Review of Systems Review of Systems  Constitutional: Negative for fever.  HENT: Positive for dental problem.      Physical Exam Triage Vital Signs ED Triage Vitals  Enc Vitals Group     BP 03/05/20 1020 (!) 211/124     Pulse Rate 03/05/20 1020 97     Resp 03/05/20 1020 20     Temp 03/05/20 1020 98.3 F (36.8 C)      Temp Source 03/05/20 1020 Oral     SpO2 03/05/20 1020 98 %     Weight 03/05/20 1020 250 lb (113.4 kg)     Height 03/05/20 1020 5\' 7"  (1.702 m)     Head Circumference --      Peak Flow --      Pain Score 03/05/20 1019 8     Pain Loc --      Pain Edu? --      Excl. in GC? --    No data found.  Updated Vital Signs BP (!) 211/124 (BP Location: Right Arm) Comment: States that he has not been taking his medication regularly.  Pulse 97   Temp 98.3 F (36.8 C) (Oral)   Resp 20   Ht 5\' 7"  (1.702 m)   Wt 250 lb (113.4 kg)   SpO2 98%   BMI 39.16 kg/m   Visual Acuity Right Eye Distance:   Left Eye Distance:   Bilateral Distance:    Right Eye Near:   Left Eye Near:    Bilateral Near:     Physical Exam Vitals and nursing note reviewed.  Constitutional:      General: He is not in acute distress.    Appearance: Normal appearance. He is not ill-appearing.  HENT:     Head: Normocephalic and atraumatic.     Right Ear: Tympanic membrane, ear canal and external ear normal.     Left Ear: Tympanic membrane, ear canal and external ear normal.     Mouth/Throat:     Pharynx: Posterior oropharyngeal erythema present. No oropharyngeal exudate.  Skin:    General: Skin is warm and dry.     Capillary Refill: Capillary refill takes less than 2 seconds.     Findings: No erythema or rash.  Neurological:     General: No focal deficit present.     Mental Status: He is alert and oriented to person, place, and time.  Psychiatric:        Mood and Affect: Mood normal.        Behavior: Behavior normal.        Thought Content: Thought content normal.        Judgment: Judgment normal.      UC Treatments / Results  Labs (all labs ordered are listed, but only abnormal results are displayed) Labs Reviewed - No data to display  EKG   Radiology No results found.  Procedures Procedures (including critical care time)  Medications Ordered in UC Medications - No data to  display  Initial Impression / Assessment and Plan / UC Course  I have reviewed the triage vital signs and the nursing notes.  Pertinent labs &  imaging results that were available during my care of the patient were reviewed by me and considered in my medical decision making (see chart for details).   Patient is a very pleasant 50 year old male here for evaluation of dental pain and swelling x1 week.  Patient has a long history of dental issues secondary to having cleft palate as a child.  Patient reports that his left upper premolar broke off 6 to 8 months ago that he has had one previous dental infection that resolved with antibiotics.  This current episode is been going on for the past week.  Physical exam reveals erythematous and edematous gum tissue surrounding a fractured molar.  Patient has very poor and few dentition.  There is no drainage appreciated.  The hard palate and gum tissue are not boggy or fluctuant.  We will treat patient with Augmentin twice daily x10 days for possible dental infection, give Norco for severe pain, over-the-counter Tylenol and ibuprofen as needed for mild to moderate pain, salt water gargles after meals and at bedtime, and will encourage patient to reach out to the faculty practice at [facility] school of dentistry since he was seen there previously.  Patient also will be given a work note and have advised him to attempt to be seen at the dental urgent care at the [facility] school of dentistry.   Final Clinical Impressions(s) / UC Diagnoses   Final diagnoses:  Dental infection  Open fracture of tooth, initial encounter     Discharge Instructions     You need to be evaluated by a dentist.  I suggest that you try make an appointment with the [facility] faculty practice since you were seen there previously.  You can also try and access the dental urgent care.  They are open Monday through Friday 9 AM to 5 PM.  It is first come first serve.  Take the  Augmentin twice daily with food for 10 days for treatment infection.  Use over-the-counter Tylenol and ibuprofen as needed for mild to moderate pain and take the Norco as needed for severe pain.  Do not drink alcohol, drive a vehicle, or operate machinery if you take this medication.  If you develop fever, greater facial swelling, or more intense pain I recommend you go to the ER at [facility] to be evaluated by the dentist on-call.    ED Prescriptions    Medication Sig Dispense Auth. Provider   amoxicillin-clavulanate (AUGMENTIN) 875-125 MG tablet Take 1 tablet by mouth every 12 (twelve) hours for 10 days. 20 tablet Becky Augusta, NP   HYDROcodone-acetaminophen (NORCO/VICODIN) 5-325 MG tablet Take 2 tablets by mouth every 4 (four) hours as needed. 10 tablet Becky Augusta, NP     I have reviewed the PDMP during this encounter.   Becky Augusta, NP 03/05/20 1048

## 2020-03-05 NOTE — Discharge Instructions (Addendum)
You need to be evaluated by a dentist.  I suggest that you try make an appointment with the [facility] faculty practice since you were seen there previously.  You can also try and access the dental urgent care.  They are open Monday through Friday 9 AM to 5 PM.  It is first come first serve.  Take the Augmentin twice daily with food for 10 days for treatment infection.  Use over-the-counter Tylenol and ibuprofen as needed for mild to moderate pain and take the Norco as needed for severe pain.  Do not drink alcohol, drive a vehicle, or operate machinery if you take this medication.  If you develop fever, greater facial swelling, or more intense pain I recommend you go to the ER at [facility] to be evaluated by the dentist on-call.

## 2020-03-19 ENCOUNTER — Ambulatory Visit
Admission: EM | Admit: 2020-03-19 | Discharge: 2020-03-19 | Disposition: A | Discharge status: Home / Self Care | Payer: Self-pay | Attending: Emergency Medicine | Admitting: Emergency Medicine

## 2020-03-19 ENCOUNTER — Encounter: Payer: Self-pay | Admitting: Emergency Medicine

## 2020-03-19 ENCOUNTER — Other Ambulatory Visit: Payer: Self-pay

## 2020-03-19 DIAGNOSIS — N410 Acute prostatitis: Secondary | ICD-10-CM | POA: Insufficient documentation

## 2020-03-19 LAB — COMPREHENSIVE METABOLIC PANEL WITH GFR
ALT: 25 U/L (ref 0–44)
AST: 21 U/L (ref 15–41)
Albumin: 4.3 g/dL (ref 3.5–5.0)
Alkaline Phosphatase: 49 U/L (ref 38–126)
Anion gap: 11 (ref 5–15)
BUN: 11 mg/dL (ref 6–20)
CO2: 23 mmol/L (ref 22–32)
Calcium: 9.7 mg/dL (ref 8.9–10.3)
Chloride: 101 mmol/L (ref 98–111)
Creatinine, Ser: 0.71 mg/dL (ref 0.61–1.24)
GFR, Estimated: >60 mL/min
Glucose, Bld: 143 mg/dL — ABNORMAL HIGH (ref 70–99)
Potassium: 4 mmol/L (ref 3.5–5.1)
Sodium: 135 mmol/L (ref 135–145)
Total Bilirubin: 0.8 mg/dL (ref 0.3–1.2)
Total Protein: 7.4 g/dL (ref 6.5–8.1)

## 2020-03-19 LAB — URINALYSIS, COMPLETE (UACMP) WITH MICROSCOPIC
Bilirubin Urine: NEGATIVE
Glucose, UA: NEGATIVE mg/dL
Hgb urine dipstick: NEGATIVE
Ketones, ur: NEGATIVE mg/dL
Leukocytes,Ua: NEGATIVE
Nitrite: NEGATIVE
Protein, ur: NEGATIVE mg/dL
Specific Gravity, Urine: 1.02 (ref 1.005–1.030)
pH: 5.5 (ref 5.0–8.0)

## 2020-03-19 MED ORDER — CIPROFLOXACIN HCL 500 MG PO TABS: 500.0000 mg | ORAL_TABLET | Freq: Two times a day (BID) | ORAL | 0 refills | Status: DC

## 2020-03-19 NOTE — ED Provider Notes (Signed)
[facility]    CSN: [id number] Arrival date & time: 03/19/20  5035      History   Chief Complaint Chief Complaint  Patient presents with  . Urinary Frequency  . Back Pain  . Dysuria         HPI DEWITTE VANNICE is a 50 y.o. male.   HPI   50 year old male here for evaluation of urinary complaints.  Patient reports that he has been experiencing urinary urgency, frequency, pain with urination, and low back pain for the last 2 days.  Patient also reports that he has had an increase in his urination at nighttime.  Patient states this feels like the last time he had prostatitis.  Patient denies fever, blood in his urine, cloudiness to his urine, abdominal pain, nausea, vomiting, or chills or rigors.    Past Medical History:  Diagnosis Date  . Bipolar 1 disorder (HCC)   . Cleft palate and lip   . Malachi Carl infection   . Hypertension   . Right knee injury     Patient Active Problem List   Diagnosis Date Noted  . Annual physical exam 11/22/2019  . Chest pain of uncertain etiology 06/06/2018  . SOB (shortness of breath) 06/05/2018  . COPD (chronic obstructive pulmonary disease) (HCC) 06/05/2018  . Essential hypertension 02/15/2017    Past Surgical History:  Procedure Laterality Date  . CLEFT PALATE REPAIR         Home Medications    Prior to Admission medications   Medication Sig Start Date End Date Taking? Authorizing Provider  ciprofloxacin (CIPRO) 500 MG tablet Take 1 tablet (500 mg total) by mouth 2 (two) times daily. 03/19/20  Yes Becky Augusta, NP  hydrochlorothiazide (HYDRODIURIL) 25 MG tablet TAKE 1 TABLET BY MOUTH ONCE DAILY 02/27/19  Yes Karamalegos, Netta Neat, DO  lisinopril (ZESTRIL) 20 MG tablet TAKE 1 TABLET BY MOUTH ONCE DAILY 02/27/19  Yes Karamalegos, Netta Neat, DO  Multiple Vitamin (MULTI VITAMIN DAILY PO) Take by mouth.    [provider]  albuterol (PROVENTIL HFA;VENTOLIN HFA) 108 (90 Base) MCG/ACT inhaler Inhale 1-2  puffs into the lungs every 6 (six) hours as needed for wheezing or shortness of breath. Patient not taking: No sig reported 04/20/18 03/05/20  Galen Manila, NP  Fluticasone-Salmeterol (ADVAIR) 100-50 MCG/DOSE AEPB Inhale 1 puff into the lungs 2 (two) times daily. Patient not taking: No sig reported 04/25/18 03/05/20  Galen Manila, NP  gabapentin (NEURONTIN) 100 MG capsule TAKE 1 CAPSULE BY MOUTH 3 TIMES DAILY Patient not taking: No sig reported 10/10/17 03/05/20  Galen Manila, NP  ipratropium (ATROVENT) 0.06 % nasal spray Place 2 sprays into both nostrils 4 (four) times daily. Patient not taking: No sig reported 04/05/17 03/05/20  Galen Manila, NP    Family History Family History  Problem Relation Age of Onset  . Leukemia Mother   . Diabetes Father   . Hypertension Father     Social History Social History   Tobacco Use  . Smoking status: Current Every Day Smoker    Packs/day: 0.25    Years: 15.00    Pack years: 3.75    Types: Cigarettes  . Smokeless tobacco: Never Used  Vaping Use  . Vaping Use: Never used  Substance Use Topics  . Alcohol use: Yes    Alcohol/week: 12.0 standard drinks    Types: 12 Cans of beer per week  . Drug use: No     Allergies  Darvon [propoxyphene]   Review of Systems Review of Systems  Constitutional: Negative for chills, diaphoresis and fever.  Gastrointestinal: Negative for abdominal pain, diarrhea, nausea and vomiting.  Genitourinary: Positive for difficulty urinating, dysuria, frequency and urgency. Negative for decreased urine volume and hematuria.  Musculoskeletal: Positive for back pain.     Physical Exam Triage Vital Signs ED Triage Vitals [03/19/20 0952]  Enc Vitals Group     BP      Pulse      Resp      Temp      Temp src      SpO2      Weight 250 lb (113.4 kg)     Height 5\' 7"  (1.702 m)     Head Circumference      Peak Flow      Pain Score 8     Pain Loc      Pain Edu?      Excl. in GC?     No data found.  Updated Vital Signs BP (!) 195/132 (BP Location: Left Arm)   Pulse (!) 112   Temp 98.4 F (36.9 C) (Oral)   Resp 18   Ht 5\' 7"  (1.702 m)   Wt 250 lb (113.4 kg)   SpO2 98%   BMI 39.16 kg/m   Visual Acuity Right Eye Distance:   Left Eye Distance:   Bilateral Distance:    Right Eye Near:   Left Eye Near:    Bilateral Near:     Physical Exam Vitals and nursing note reviewed.  Constitutional:      General: He is not in acute distress.    Appearance: Normal appearance. He is not ill-appearing.  HENT:     Head: Normocephalic and atraumatic.  Cardiovascular:     Rate and Rhythm: Normal rate and regular rhythm.     Pulses: Normal pulses.     Heart sounds: Normal heart sounds. No murmur heard. No gallop.   Pulmonary:     Effort: Pulmonary effort is normal.     Breath sounds: Normal breath sounds. No wheezing, rhonchi or rales.  Abdominal:     General: Bowel sounds are normal.     Palpations: Abdomen is soft.     Tenderness: There is abdominal tenderness. There is no right CVA tenderness, left CVA tenderness, guarding or rebound.  Genitourinary:    Rectum: Normal.  Skin:    General: Skin is warm and dry.     Capillary Refill: Capillary refill takes less than 2 seconds.     Findings: No erythema or rash.  Neurological:     General: No focal deficit present.     Mental Status: He is alert and oriented to person, place, and time.  Psychiatric:        Mood and Affect: Mood normal.        Behavior: Behavior normal.        Thought Content: Thought content normal.        Judgment: Judgment normal.      UC Treatments / Results  Labs (all labs ordered are listed, but only abnormal results are displayed) Labs Reviewed  URINALYSIS, COMPLETE (UACMP) WITH MICROSCOPIC - Abnormal; Notable for the following components:      Result Value   Bacteria, UA FEW (*)    All other components within normal limits  COMPREHENSIVE METABOLIC PANEL - Abnormal; Notable  for the following components:   Glucose, Bld 143 (*)    All other components within normal  limits    EKG   Radiology No results found.  Procedures Procedures (including critical care time)  Medications Ordered in UC Medications - No data to display  Initial Impression / Assessment and Plan / UC Course  I have reviewed the triage vital signs and the nursing notes.  Pertinent labs & imaging results that were available during my care of the patient were reviewed by me and considered in my medical decision making (see chart for details).   Patient is a very pleasant 50 year old male here for evaluation of urinary complaints of being on for last 2 days.  Patient has a strong odor of urine about him but no uremic frost or swelling.  Patient reports that he has had pain, urgency, frequency, and low back pain.  Patient states that he feels like he does not completely empty his bladder.  Patient was reports that this feels like last time he had prostatitis.  Physical exam reveals clear lung sounds in all fields, heart sounds S1-S2 and crisp, abdomen is soft, nondistended with positive bowel sounds in all quadrants.  Patient does have mild suprapubic tenderness but otherwise abdomen is benign.  Patient has no CVA tenderness.  Rectal exam reveals a tender prostate that is smooth and does not appear to be enlarged.  Will check CMP and UA.  Urinalysis is unremarkable save for a few bacteria.  CMP shows elevated glucose of 143 but electrolytes and transaminases are all normal.  BUN is 11 and creatinine is 0.71.  Will treat patient for prostatitis with Cipro twice daily x14 days and have him follow-up with his primary care provider.   Final Clinical Impressions(s) / UC Diagnoses   Final diagnoses:  Prostatitis, acute     Discharge Instructions     Take the Cipro twice daily for 14 days.  Increase your oral fluid intake so that you increase your production and help flush your urinary  system.  If you develop any new or worsening symptoms return for reevaluation or see your primary care provider.    Your blood pressure is markedly elevated here in clinic at 195/132.  You need to follow-up with your primary care provider as you may need a medication adjustment.    ED Prescriptions    Medication Sig Dispense Auth. Provider   ciprofloxacin (CIPRO) 500 MG tablet Take 1 tablet (500 mg total) by mouth 2 (two) times daily. 28 tablet Becky Augusta, NP     PDMP not reviewed this encounter.   Becky Augusta, NP 03/19/20 1045

## 2020-03-19 NOTE — ED Triage Notes (Signed)
Patient c/o urinary frequency, urgency, dysuria and back pain that started 2 days ago. He also reports he is unable to completely empty his bladder. He states he has a history of prostatitis in the past.

## 2020-03-19 NOTE — Discharge Instructions (Addendum)
Take the Cipro twice daily for 14 days.  Increase your oral fluid intake so that you increase your production and help flush your urinary system.  If you develop any new or worsening symptoms return for reevaluation or see your primary care provider.    Your blood pressure is markedly elevated here in clinic at 195/132.  You need to follow-up with your primary care provider as you may need a medication adjustment.

## 2020-03-22 ENCOUNTER — Other Ambulatory Visit: Payer: Self-pay | Admitting: Family Medicine

## 2020-03-22 DIAGNOSIS — I10 Essential (primary) hypertension: Secondary | ICD-10-CM

## 2020-03-22 NOTE — Telephone Encounter (Signed)
Requested Prescriptions  Pending Prescriptions Disp Refills  . hydrochlorothiazide (HYDRODIURIL) 25 MG tablet [Pharmacy Med Name: HYDROCHLOROTHIAZIDE 25 MG TAB] 90 tablet 0    Sig: TAKE 1 TABLET BY MOUTH ONCE DAILY     Cardiovascular: Diuretics - Thiazide Failed - 03/22/2020 10:26 AM      Failed - Last BP in normal range    BP Readings from Last 1 Encounters:  03/19/20 (!) 195/132         Passed - Ca in normal range and within 360 days    Calcium  Date Value Ref Range Status  03/19/2020 9.7 8.9 - 10.3 mg/dL Final         Passed - Cr in normal range and within 360 days    Creat  Date Value Ref Range Status  03/18/2017 0.88 0.60 - 1.35 mg/dL Final   Creatinine, Ser  Date Value Ref Range Status  03/19/2020 0.71 0.61 - 1.24 mg/dL Final         Passed - K in normal range and within 360 days    Potassium  Date Value Ref Range Status  03/19/2020 4.0 3.5 - 5.1 mmol/L Final         Passed - Na in normal range and within 360 days    Sodium  Date Value Ref Range Status  03/19/2020 135 135 - 145 mmol/L Final         Passed - Valid encounter within last 6 months    Recent Outpatient Visits          4 months ago Annual physical exam   [facility], Jodelle Gross, FNP   1 year ago Reactive airway disease without complication, unspecified asthma severity, unspecified whether persistent   [facility] Galen Manila, NP   2 years ago Dyspnea on exertion   [facility] Kyung Rudd, Alison Stalling, NP   2 years ago Herpes zoster virus infection of face and ear nerves   [facility] Galen Manila, NP   2 years ago Flu-like symptoms   [facility] Kyung Rudd, Alison Stalling, NP

## 2020-10-17 IMAGING — CR DG THORACIC SPINE 2V
1 series · 3 of 3 positions shown · non-contrast
Comparison: Radiographs April 05, 2017.

CLINICAL DATA: Back pain after fall.

EXAM:
THORACIC SPINE 2 VIEWS

[Series 1: dg thoracic spine 2 view · 0.14mm/px · 3 of 3 slices shown]
[im 1/3]
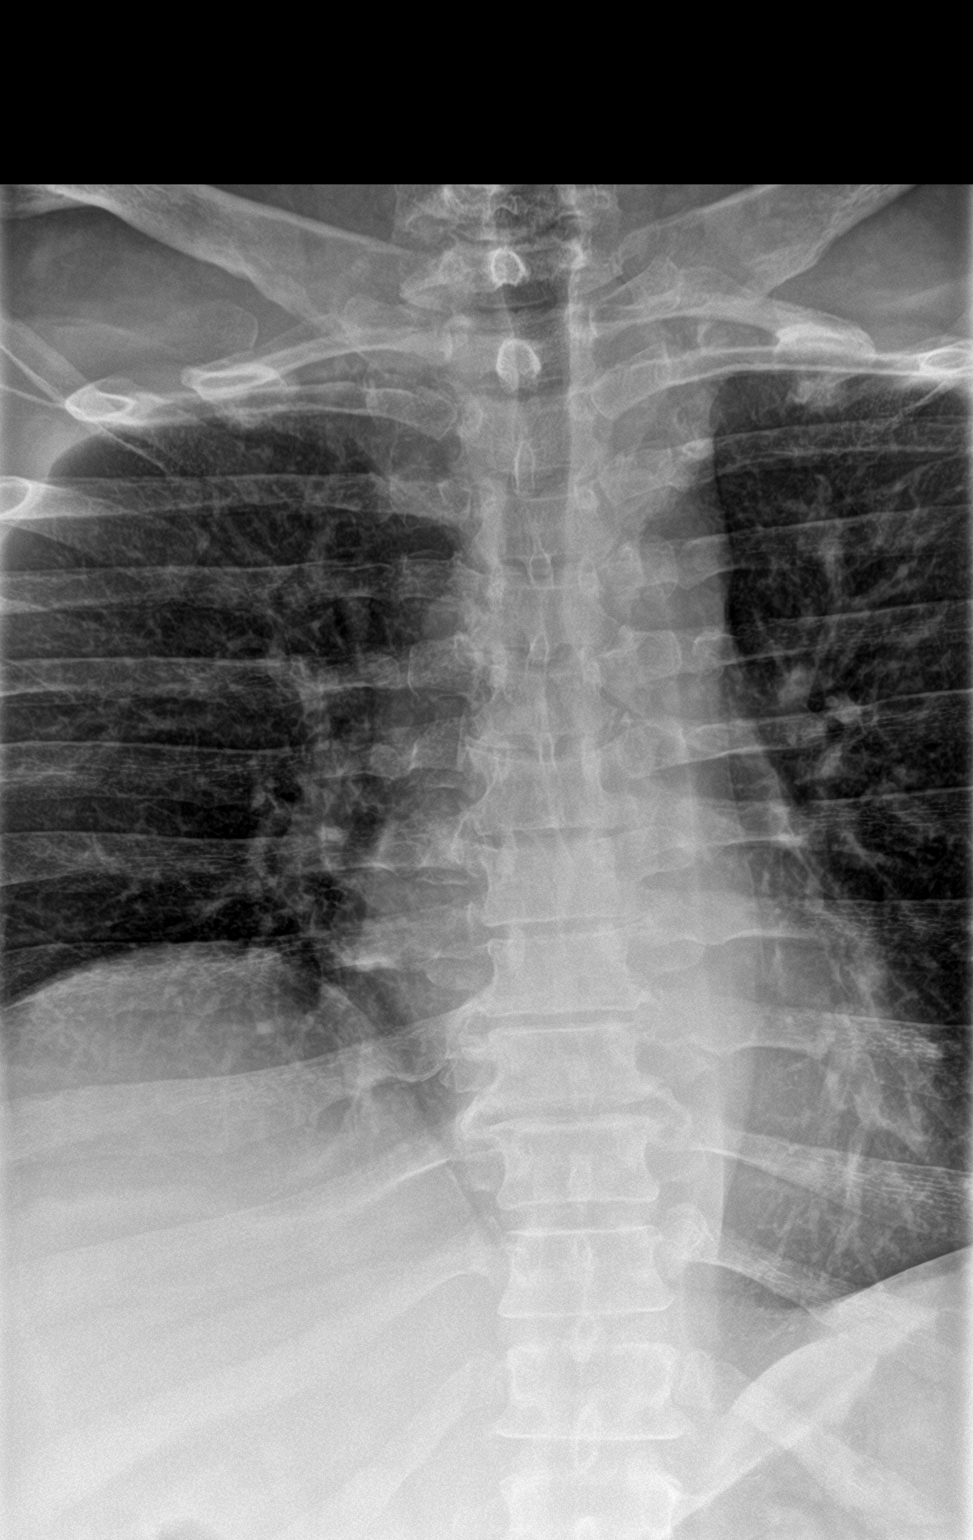
[im 2/3]
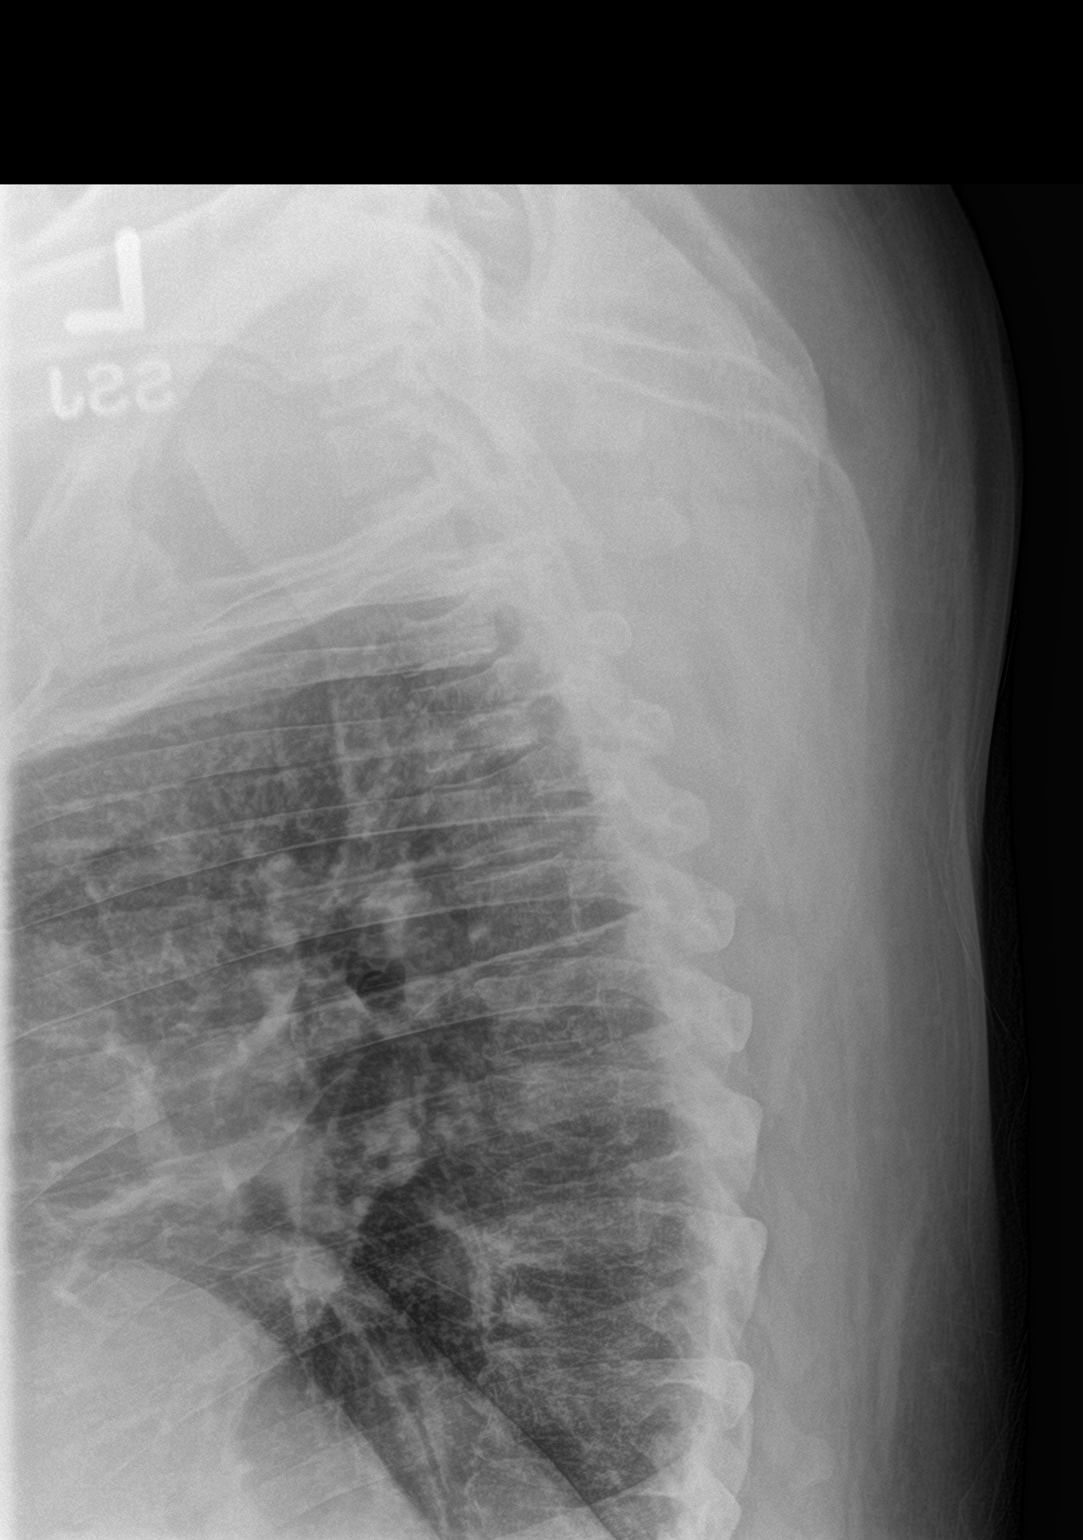
[im 3/3]
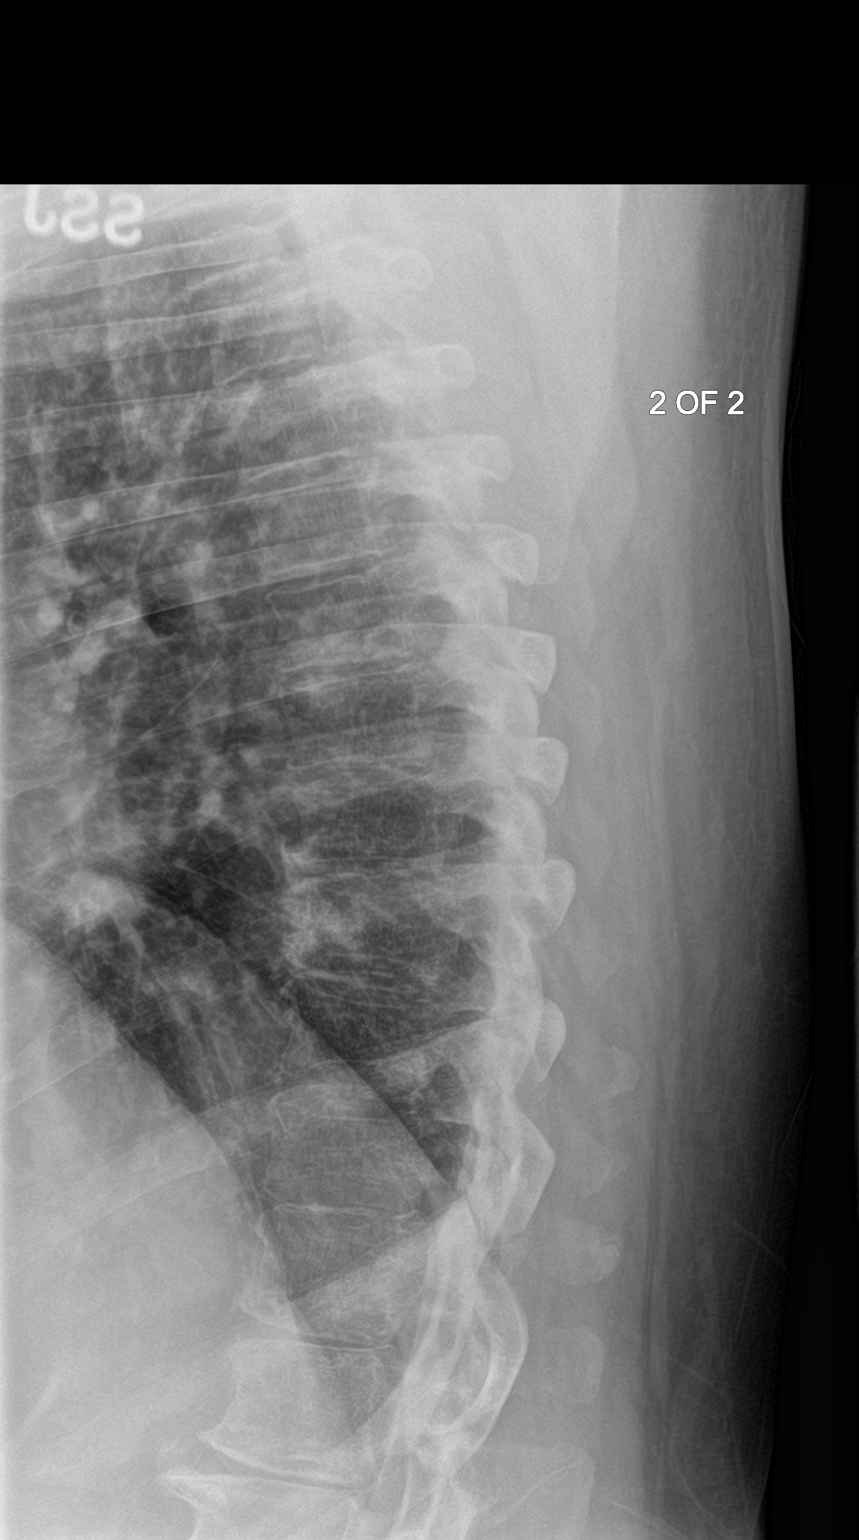

[3 of 3 positions shown; findings below may reference images not displayed]

FINDINGS: No fracture or spondylolisthesis is noted. Mild osteophyte formation
is seen involving multiple levels of the lower thoracic spine.
IMPRESSION: Mild multilevel degenerative disc disease. No acute abnormality seen
in the thoracic spine.

## 2020-12-24 ENCOUNTER — Encounter: Payer: Self-pay | Admitting: Emergency Medicine

## 2020-12-24 ENCOUNTER — Ambulatory Visit
Admission: EM | Admit: 2020-12-24 | Discharge: 2020-12-24 | Disposition: A | Discharge status: Home / Self Care | Payer: Self-pay | Attending: Medical Oncology | Admitting: Medical Oncology

## 2020-12-24 ENCOUNTER — Other Ambulatory Visit: Payer: Self-pay

## 2020-12-24 DIAGNOSIS — K029 Dental caries, unspecified: Secondary | ICD-10-CM

## 2020-12-24 DIAGNOSIS — K047 Periapical abscess without sinus: Secondary | ICD-10-CM

## 2020-12-24 DIAGNOSIS — I10 Essential (primary) hypertension: Secondary | ICD-10-CM

## 2020-12-24 MED ORDER — LISINOPRIL 20 MG PO TABS: 20.0000 mg | ORAL_TABLET | Freq: Every day | ORAL | 0 refills | Status: AC

## 2020-12-24 MED ORDER — BENZONATATE 100 MG PO CAPS: 100.0000 mg | ORAL_CAPSULE | Freq: Three times a day (TID) | ORAL | 0 refills | Status: DC

## 2020-12-24 MED ORDER — HYDROCODONE-ACETAMINOPHEN 5-325 MG PO TABS: 1.0000 | ORAL_TABLET | Freq: Four times a day (QID) | ORAL | 0 refills | Status: AC | PRN

## 2020-12-24 MED ORDER — HYDROCHLOROTHIAZIDE 25 MG PO TABS: 25.0000 mg | ORAL_TABLET | Freq: Every day | ORAL | 0 refills | Status: AC

## 2020-12-24 MED ORDER — AMOXICILLIN-POT CLAVULANATE 875-125 MG PO TABS: 1.0000 | ORAL_TABLET | Freq: Two times a day (BID) | ORAL | 0 refills | Status: DC

## 2020-12-24 NOTE — ED Provider Notes (Signed)
[facility]    CSN: [id number] Arrival date & time: 12/24/20  0949      History   Chief Complaint Chief Complaint  Patient presents with   Dental Pain    HPI Scott Campos is a 50 y.o. male.   HPI  Dental Pain: Pt reports that for the past 5 days he has had dental pain of his right side of his mouth of his lower tooth.  He reports that he was born with a cleft palate and has had significant troubles with his teeth for his whole entire life.  He reports that the tooth that is giving him trouble has had multiple fillings that have recently fallen out.  He has tried some dental putty to see if this would help with the symptoms but they have only worsened.  He is having trouble eating and drinking due to the discomfort.  He denies any trouble breathing or swallowing and no vomiting or fevers.  He reports that he is trying to get in with a dentist for follow-up.  Currently he has been using ibuprofen for symptoms with very minimal relief.  Of note he currently is out of his lisinopril and hydrochlorothiazide that he needs to pick up from his pharmacy.  He denies any chest pain, shortness of breath or peripheral edema. He attributes his elevated BP to his pain as well.  He also reports that he has had an off-and-on cough for the past week and asked if he can have some cough medication as well  Past Medical History:  Diagnosis Date   Bipolar 1 disorder (HCC)    Cleft palate and lip    Epstein Barr infection    Hypertension    Right knee injury     Patient Active Problem List   Diagnosis Date Noted   Annual physical exam 11/22/2019   Chest pain of uncertain etiology 06/06/2018   SOB (shortness of breath) 06/05/2018   COPD (chronic obstructive pulmonary disease) (HCC) 06/05/2018   Essential hypertension 02/15/2017    Past Surgical History:  Procedure Laterality Date   CLEFT PALATE REPAIR         Home Medications    Prior to Admission medications    Medication Sig Start Date End Date Taking? Authorizing Provider  hydrochlorothiazide (HYDRODIURIL) 25 MG tablet TAKE 1 TABLET BY MOUTH ONCE DAILY 03/22/20  Yes Reubin Milan, MD  lisinopril (ZESTRIL) 20 MG tablet TAKE 1 TABLET BY MOUTH ONCE DAILY 02/27/19  Yes Karamalegos, Netta Neat, DO  ciprofloxacin (CIPRO) 500 MG tablet Take 1 tablet (500 mg total) by mouth 2 (two) times daily. 03/19/20   Becky Augusta, NP  Multiple Vitamin (MULTI VITAMIN DAILY PO) Take by mouth.    [provider]  albuterol (PROVENTIL HFA;VENTOLIN HFA) 108 (90 Base) MCG/ACT inhaler Inhale 1-2 puffs into the lungs every 6 (six) hours as needed for wheezing or shortness of breath. Patient not taking: No sig reported 04/20/18 03/05/20  Galen Manila, NP  Fluticasone-Salmeterol (ADVAIR) 100-50 MCG/DOSE AEPB Inhale 1 puff into the lungs 2 (two) times daily. Patient not taking: No sig reported 04/25/18 03/05/20  Galen Manila, NP  gabapentin (NEURONTIN) 100 MG capsule TAKE 1 CAPSULE BY MOUTH 3 TIMES DAILY Patient not taking: No sig reported 10/10/17 03/05/20  Galen Manila, NP  ipratropium (ATROVENT) 0.06 % nasal spray Place 2 sprays into both nostrils 4 (four) times daily. Patient not taking: No sig reported 04/05/17 03/05/20  Galen Manila, NP  Family History Family History  Problem Relation Age of Onset   Leukemia Mother    Diabetes Father    Hypertension Father     Social History Social History   Tobacco Use   Smoking status: Every Day    Packs/day: 0.25    Years: 15.00    Pack years: 3.75    Types: Cigarettes   Smokeless tobacco: Never  Vaping Use   Vaping Use: Never used  Substance Use Topics   Alcohol use: Yes    Alcohol/week: 12.0 standard drinks    Types: 12 Cans of beer per week   Drug use: No     Allergies   Darvon [propoxyphene]   Review of Systems Review of Systems  As stated above in HPI Physical Exam Triage Vital Signs ED Triage Vitals  Enc Vitals  Group     BP 12/24/20 1133 (!) 192/124     Pulse Rate 12/24/20 1133 88     Resp --      Temp 12/24/20 1133 98.4 F (36.9 C)     Temp Source 12/24/20 1133 Oral     SpO2 12/24/20 1133 100 %     Weight --      Height --      Head Circumference --      Peak Flow --      Pain Score 12/24/20 1134 10     Pain Loc --      Pain Edu? --      Excl. in GC? --    No data found.  Updated Vital Signs BP (!) 192/124 (BP Location: Right Arm) Comment: Pt has not taken BP medication  Pulse 88   Temp 98.4 F (36.9 C) (Oral)   SpO2 100%   Physical Exam Vitals and nursing note reviewed.  Constitutional:      General: He is not in acute distress.    Appearance: Normal appearance. He is not ill-appearing, toxic-appearing or diaphoretic.  HENT:     Head: Normocephalic and atraumatic.     Nose: Nose normal.     Mouth/Throat:     Mouth: Mucous membranes are moist.   Cardiovascular:     Rate and Rhythm: Normal rate and regular rhythm.     Heart sounds: Normal heart sounds.  Pulmonary:     Effort: Pulmonary effort is normal.     Breath sounds: Normal breath sounds.  Musculoskeletal:     Cervical back: Normal range of motion and neck supple.  Lymphadenopathy:     Cervical: No cervical adenopathy.  Neurological:     Mental Status: He is alert.     UC Treatments / Results  Labs (all labs ordered are listed, but only abnormal results are displayed) Labs Reviewed - No data to display  EKG   Radiology No results found.  Procedures Procedures (including critical care time)  Medications Ordered in UC Medications - No data to display  Initial Impression / Assessment and Plan / UC Course  I have reviewed the triage vital signs and the nursing notes.  Pertinent labs & imaging results that were available during my care of the patient were reviewed by me and considered in my medical decision making (see chart for details).     New. Dental abscess with pain.  Treating with Augmentin  to help with his dental abscess.  We will have him stop the ibuprofen and instead start Norco which she has tolerated well in the past to help with his pain and to  help reduce the elevated blood pressure readings that likely are occurring from his ibuprofen use and pain.  Precautions, black box warnings, side effects.  We discussed the importance of following up with a dentist for removal of the tooth.  We discussed red flag signs and symptoms as well. Final Clinical Impressions(s) / UC Diagnoses   Final diagnoses:  None   Discharge Instructions   None    ED Prescriptions   None    PDMP not reviewed this encounter.   Rushie Chestnut, [place] 12/24/20 1224

## 2020-12-24 NOTE — ED Triage Notes (Signed)
Pt c/o tooth pain on the right side x 5 days.

## 2021-01-20 ENCOUNTER — Other Ambulatory Visit: Payer: Self-pay

## 2021-01-20 ENCOUNTER — Ambulatory Visit
Admission: EM | Admit: 2021-01-20 | Discharge: 2021-01-20 | Disposition: A | Discharge status: Home / Self Care | Payer: Self-pay | Attending: Internal Medicine | Admitting: Internal Medicine

## 2021-01-20 DIAGNOSIS — H6992 Unspecified Eustachian tube disorder, left ear: Secondary | ICD-10-CM

## 2021-01-20 DIAGNOSIS — R42 Dizziness and giddiness: Secondary | ICD-10-CM

## 2021-01-20 DIAGNOSIS — H9202 Otalgia, left ear: Secondary | ICD-10-CM

## 2021-01-20 DIAGNOSIS — H6692 Otitis media, unspecified, left ear: Secondary | ICD-10-CM

## 2021-01-20 MED ORDER — AMOXICILLIN-POT CLAVULANATE 875-125 MG PO TABS: 1.0000 | ORAL_TABLET | Freq: Two times a day (BID) | ORAL | 0 refills | Status: DC

## 2021-01-20 MED ORDER — FEXOFENADINE HCL 180 MG PO TABS: 180.0000 mg | ORAL_TABLET | Freq: Every day | ORAL | 0 refills | Status: AC

## 2021-01-20 MED ORDER — FLUTICASONE PROPIONATE 50 MCG/ACT NA SUSP: 2.0000 | Freq: Every day | NASAL | 0 refills | Status: AC

## 2021-01-20 NOTE — ED Triage Notes (Signed)
States "fluid in my left ear."  History of sinus issues. Reports it is causing him to lose his balance.

## 2021-01-20 NOTE — ED Provider Notes (Signed)
[facility]    CSN: [id number] Arrival date & time: 01/20/21  [id number]      History   Chief Complaint Chief Complaint  Patient presents with   Otalgia    HPI Scott Campos is a 51 y.o. male who presents with onset of L ear pain 3-4 days ago and feeling is stuffy. Had Augmentin left over from dental infection and has been taking it for 3 days. This am felt off balance and is missing work and needs a note. 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 Denies a fever.     Past Medical History:  Diagnosis Date   Bipolar 1 disorder (HCC)    Cleft palate and lip    Epstein Barr infection    Hypertension    Right knee injury     Patient Active Problem List   Diagnosis Date Noted   Annual physical exam 11/22/2019   Chest pain of uncertain etiology 06/06/2018   SOB (shortness of breath) 06/05/2018   COPD (chronic obstructive pulmonary disease) (HCC) 06/05/2018   Essential hypertension 02/15/2017    Past Surgical History:  Procedure Laterality Date   CLEFT PALATE REPAIR         Home Medications    Prior to Admission medications   Medication Sig Start Date End Date Taking? Authorizing Provider  amoxicillin-clavulanate (AUGMENTIN) 875-125 MG tablet Take 1 tablet by mouth every 12 (twelve) hours. 01/20/21  Yes Rodriguez-Southworth, Nettie ElmSylvia, PA-C  fexofenadine (ALLEGRA ALLERGY) 180 MG tablet Take 1 tablet (180 mg total) by mouth daily. 01/20/21  Yes Rodriguez-Southworth, Nettie ElmSylvia, PA-C  fluticasone (FLONASE) 50 MCG/ACT nasal spray Place 2 sprays into both nostrils daily. 01/20/21  Yes Rodriguez-Southworth, Nettie ElmSylvia, PA-C  hydrochlorothiazide (HYDRODIURIL) 25 MG tablet Take 1 tablet (25 mg total) by mouth daily. 12/24/20   Rushie Chestnutovington, Sarah M, PA-C  lisinopril (ZESTRIL) 20 MG tablet Take 1 tablet (20 mg total) by mouth daily. 12/24/20   Rushie Chestnutovington, Sarah M, PA-C  Multiple Vitamin (MULTI VITAMIN DAILY PO) Take by mouth.    [provider]  albuterol  (PROVENTIL HFA;VENTOLIN HFA) 108 (90 Base) MCG/ACT inhaler Inhale 1-2 puffs into the lungs every 6 (six) hours as needed for wheezing or shortness of breath. Patient not taking: No sig reported 04/20/18 03/05/20  Galen ManilaKennedy, Lauren Renee, NP  Fluticasone-Salmeterol (ADVAIR) 100-50 MCG/DOSE AEPB Inhale 1 puff into the lungs 2 (two) times daily. Patient not taking: No sig reported 04/25/18 03/05/20  Galen ManilaKennedy, Lauren Renee, NP  gabapentin (NEURONTIN) 100 MG capsule TAKE 1 CAPSULE BY MOUTH 3 TIMES DAILY Patient not taking: No sig reported 10/10/17 03/05/20  Galen ManilaKennedy, Lauren Renee, NP  ipratropium (ATROVENT) 0.06 % nasal spray Place 2 sprays into both nostrils 4 (four) times daily. Patient not taking: No sig reported 04/05/17 03/05/20  Galen ManilaKennedy, Lauren Renee, NP    Family History Family History  Problem Relation Age of Onset   Leukemia Mother    Diabetes Father    Hypertension Father     Social History Social History   Tobacco Use   Smoking status: Every Day    Packs/day: 0.25    Years: 15.00    Pack years: 3.75    Types: Cigarettes   Smokeless tobacco: Never  Vaping Use   Vaping Use: Never used  Substance Use Topics   Alcohol use: Yes    Alcohol/week: 12.0 standard drinks    Types: 12 Cans of beer per week   Drug use: No     Allergies  Darvon [propoxyphene]   Review of Systems Review of Systems  Constitutional:  Negative for chills, diaphoresis, fatigue and fever.  HENT:  Positive for congestion, facial swelling and postnasal drip. Negative for ear pain.   Eyes:  Negative for discharge.  Respiratory:  Negative for cough and chest tightness.   Cardiovascular:  Negative for chest pain.  Gastrointestinal:  Negative for nausea.  Neurological:  Positive for dizziness.    Physical Exam Triage Vital Signs ED Triage Vitals  Enc Vitals Group     BP 01/20/21 0842 (!) 187/119     Pulse Rate 01/20/21 0842 90     Resp 01/20/21 0842 18     Temp 01/20/21 0842 98.3 F (36.8 C)     Temp  Source 01/20/21 0842 Oral     SpO2 01/20/21 0842 97 %     Weight 01/20/21 0839 240 lb (108.9 kg)     Height 01/20/21 0839 5\' 7"  (1.702 m)     Head Circumference --      Peak Flow --      Pain Score 01/20/21 0839 7     Pain Loc --      Pain Edu? --      Excl. in [place]? --    No data found.  Updated Vital Signs BP (!) 187/119 (BP Location: Right Arm)    Pulse 90    Temp 98.3 F (36.8 C) (Oral)    Resp 18    Ht 5\' 7"  (1.702 m)    Wt 240 lb (108.9 kg)    SpO2 97%    BMI 37.59 kg/m   Visual Acuity Right Eye Distance:   Left Eye Distance:   Bilateral Distance:    Right Eye Near:   Left Eye Near:    Bilateral Near:     Physical Exam Vitals and nursing note reviewed.  Constitutional:      General: He is not in acute distress.    Appearance: He is obese. He is not toxic-appearing.  HENT:     Head: Normocephalic.     Right Ear: Tympanic membrane, ear canal and external ear normal.     Left Ear: Ear canal and external ear normal.     Ears:     Comments: L TM is not red, but has orange tinge and fluid present in the lower area.     Nose: Congestion present.     Mouth/Throat:     Mouth: Mucous membranes are moist.  Eyes:     General: No scleral icterus.    Conjunctiva/sclera: Conjunctivae normal.  Cardiovascular:     Rate and Rhythm: Normal rate and regular rhythm.  Pulmonary:     Effort: Pulmonary effort is normal.     Breath sounds: Normal breath sounds.  Musculoskeletal:        General: Normal range of motion.     Cervical back: Neck supple.  Skin:    General: Skin is warm and dry.     Findings: No rash.  Neurological:     Mental Status: He is alert and oriented to person, place, and time.     Gait: Gait normal.  Psychiatric:        Mood and Affect: Mood normal.        Behavior: Behavior normal.        Thought Content: Thought content normal.        Judgment: Judgment normal.     UC Treatments / Results  Labs (all labs ordered are  listed, but only abnormal  results are displayed) Labs Reviewed - No data to display  EKG   Radiology No results found.  Procedures Procedures (including critical care time)  Medications Ordered in UC Medications - No data to display  Initial Impression / Assessment and Plan / UC Course  I have reviewed the triage vital signs and the nursing notes. L OM, SOM which more likely is causing the off balance symptoms.  I placed him on Augmentin as noted for 4 more days to complete 7 days in case there was  an infection. I also placed him on Allegra and Flonase as noted.  Final Clinical Impressions(s) / UC Diagnoses   Final diagnoses:  Left otitis media, unspecified otitis media type  Left ear pain  Eustachian tube disorder, left  Dizziness and giddiness   Discharge Instructions   None    ED Prescriptions     Medication Sig Dispense Auth. Provider   fluticasone (FLONASE) 50 MCG/ACT nasal spray Place 2 sprays into both nostrils daily. 18.2 g Rodriguez-Southworth, Nettie Elm, PA-C   fexofenadine (ALLEGRA ALLERGY) 180 MG tablet Take 1 tablet (180 mg total) by mouth daily. 14 tablet Rodriguez-Southworth, Breda Bond, PA-C   amoxicillin-clavulanate (AUGMENTIN) 875-125 MG tablet Take 1 tablet by mouth every 12 (twelve) hours. 8 tablet Rodriguez-Southworth, Nettie Elm, PA-C      PDMP not reviewed this encounter.   Garey Ham, [place] 01/20/21 [phone]

## 2021-01-29 ENCOUNTER — Telehealth: Payer: Self-pay

## 2021-01-29 NOTE — Telephone Encounter (Signed)
Could not reach patient on phone number provided

## 2021-01-29 NOTE — Telephone Encounter (Signed)
-----   Message from Aaron Edelman, RN sent at 01/29/2021  9:22 AM EST ----- Regarding: UC to PCP Patient needs to establish with PCP - routine

## 2021-02-19 ENCOUNTER — Other Ambulatory Visit: Payer: Self-pay

## 2021-02-19 ENCOUNTER — Ambulatory Visit
Admission: EM | Admit: 2021-02-19 | Discharge: 2021-02-19 | Disposition: A | Discharge status: Home / Self Care | Payer: Self-pay | Attending: Emergency Medicine | Admitting: Emergency Medicine

## 2021-02-19 DIAGNOSIS — J069 Acute upper respiratory infection, unspecified: Secondary | ICD-10-CM

## 2021-02-19 DIAGNOSIS — H66002 Acute suppurative otitis media without spontaneous rupture of ear drum, left ear: Secondary | ICD-10-CM

## 2021-02-19 DIAGNOSIS — N410 Acute prostatitis: Secondary | ICD-10-CM

## 2021-02-19 MED ORDER — IPRATROPIUM BROMIDE 0.06 % NA SOLN: 2.0000 | Freq: Four times a day (QID) | NASAL | 12 refills | Status: AC

## 2021-02-19 MED ORDER — PROMETHAZINE-DM 6.25-15 MG/5ML PO SYRP: 5.0000 mL | ORAL_SOLUTION | Freq: Four times a day (QID) | ORAL | 0 refills | Status: DC | PRN

## 2021-02-19 MED ORDER — LEVOFLOXACIN 500 MG PO TABS: 500.0000 mg | ORAL_TABLET | Freq: Every day | ORAL | 0 refills | Status: DC

## 2021-02-19 MED ORDER — TAMSULOSIN HCL 0.4 MG PO CAPS: 0.4000 mg | ORAL_CAPSULE | Freq: Every day | ORAL | 2 refills | Status: AC

## 2021-02-19 MED ORDER — BENZONATATE 100 MG PO CAPS: 200.0000 mg | ORAL_CAPSULE | Freq: Three times a day (TID) | ORAL | 0 refills | Status: AC

## 2021-02-19 NOTE — Discharge Instructions (Addendum)
Take the levaquin  daily with food for 14 days.  Increase your oral fluid intake so as to increase urine production and help flush your urinary tract.  Prostatitis can be difficult to treat and may sometimes require a second round of antibiotics.  Please return for reevaluation, or see your primary care provider after you finish antibiotics if your symptoms have not resolved.  Return sooner for reevaluation, or go to the ER, if you develop any decrease in urine stream or inability to urinate, low back pain, flulike symptoms, fever, nausea, or vomiting.  Also denies any blood in her urine.   Use the Atrovent nasal spray, 2 squirts in each nostril every 6 hours, as needed for runny nose and postnasal drip.  Use the Tessalon Perles every 8 hours during the day.  Take them with a small sip of water.  They may give you some numbness to the base of your tongue or a metallic taste in your mouth, this is normal.  Use the Promethazine DM cough syrup at bedtime for cough and congestion.  It will make you drowsy so do not take it during the day.  Take an over-the-counter probiotic 1 hour after each dose of antibiotic to prevent diarrhea.  Use over-the-counter Tylenol and ibuprofen as needed for pain or fever.  Place a hot water bottle, or heating pad, underneath your pillowcase at night to help dilate up your ear and aid in pain relief as well as resolution of the infection.  Return for reevaluation for any new or worsening symptoms.

## 2021-02-19 NOTE — ED Provider Notes (Signed)
[facility]    CSN: [id number] Arrival date & time: 02/19/21  1131      History   Chief Complaint Chief Complaint  Patient presents with   Dysuria   Abdominal Pain   Cough    HPI Scott Campos is a 51 y.o. male.   HPI  51 year old male here for evaluation of multiple complaints.  Patient reports that for the last 2 days he has been experiencing lower abdominal pressure, pressure behind his scrotum, and painful urination.  He has a history of prostatitis and enlarged prostate for which she was taking Flomax daily but has been out for the last 2 months.  Additionally, he states that he is having difficulty starting his stream and he feels like he does not completely empty his bladder when he voids.  His other complaints are 5 days worth of runny nose, nasal congestion with clear nasal discharge, and a productive cough for clear sputum.  He denies ear pain, sore throat, shortness breath, or wheezing.  Past Medical History:  Diagnosis Date   Bipolar 1 disorder ([place])    Cleft palate and lip    Epstein Barr infection    Hypertension    Right knee injury     Patient Active Problem List   Diagnosis Date Noted   Annual physical exam 11/22/2019   Chest pain of uncertain etiology [id number]   SOB (shortness of breath) 06/05/2018   COPD (chronic obstructive pulmonary disease) ([place]) 06/05/2018   Essential hypertension 02/15/2017    Past Surgical History:  Procedure Laterality Date   CLEFT PALATE REPAIR         Home Medications    Prior to Admission medications   Medication Sig Start Date End Date Taking? Authorizing Provider  benzonatate (TESSALON) 100 MG capsule Take 2 capsules (200 mg total) by mouth every 8 (eight) hours. 02/19/21  Yes Margarette Canada, NP  ipratropium (ATROVENT) 0.06 % nasal spray Place 2 sprays into both nostrils 4 (four) times daily. 02/19/21  Yes Margarette Canada, NP  levofloxacin (LEVAQUIN) 500 MG tablet Take 1 tablet (500 mg total) by  mouth daily. 02/19/21  Yes Margarette Canada, NP  promethazine-dextromethorphan (PROMETHAZINE-DM) 6.25-15 MG/5ML syrup Take 5 mLs by mouth 4 (four) times daily as needed. 02/19/21  Yes Margarette Canada, NP  tamsulosin (FLOMAX) 0.4 MG CAPS capsule Take 1 capsule (0.4 mg total) by mouth daily. 02/19/21  Yes Margarette Canada, NP  fexofenadine [facility] ALLERGY) 180 MG tablet Take 1 tablet (180 mg total) by mouth daily. 01/20/21   Rodriguez-Southworth, Sunday Spillers, PA-C  fluticasone (FLONASE) 50 MCG/ACT nasal spray Place 2 sprays into both nostrils daily. 01/20/21   Rodriguez-Southworth, Sunday Spillers, PA-C  hydrochlorothiazide (HYDRODIURIL) 25 MG tablet Take 1 tablet (25 mg total) by mouth daily. 12/24/20   Hughie Closs, PA-C  lisinopril (ZESTRIL) 20 MG tablet Take 1 tablet (20 mg total) by mouth daily. 12/24/20   Hughie Closs, PA-C  Multiple Vitamin (MULTI VITAMIN DAILY PO) Take by mouth.    [provider]  albuterol (PROVENTIL HFA;VENTOLIN HFA) 108 (90 Base) MCG/ACT inhaler Inhale 1-2 puffs into the lungs every 6 (six) hours as needed for wheezing or shortness of breath. Patient not taking: No sig reported 04/20/18 03/05/20  Mikey College, NP  Fluticasone-Salmeterol (ADVAIR) 100-50 MCG/DOSE AEPB Inhale 1 puff into the lungs 2 (two) times daily. Patient not taking: No sig reported 04/25/18 03/05/20  Mikey College, NP  gabapentin (NEURONTIN) 100 MG capsule TAKE 1 CAPSULE BY MOUTH 3  TIMES DAILY Patient not taking: No sig reported 10/10/17 03/05/20  Mikey College, NP    Family History Family History  Problem Relation Age of Onset   Leukemia Mother    Diabetes Father    Hypertension Father     Social History Social History   Tobacco Use   Smoking status: Every Day    Packs/day: 0.25    Years: 15.00    Pack years: 3.75    Types: Cigarettes   Smokeless tobacco: Never  Vaping Use   Vaping Use: Never used  Substance Use Topics   Alcohol use: Yes    Alcohol/week: 12.0 standard drinks     Types: 12 Cans of beer per week   Drug use: No     Allergies   Darvon [propoxyphene]   Review of Systems Review of Systems  Constitutional:  Negative for fever.  HENT:  Positive for congestion and rhinorrhea. Negative for sore throat.   Respiratory:  Positive for cough. Negative for shortness of breath and wheezing.   Gastrointestinal:  Positive for abdominal pain. Negative for nausea and vomiting.  Genitourinary:  Positive for difficulty urinating and dysuria. Negative for frequency, hematuria and urgency.  Musculoskeletal:  Positive for back pain.  Skin:  Negative for rash.  Hematological: Negative.   Psychiatric/Behavioral: Negative.      Physical Exam Triage Vital Signs ED Triage Vitals  Enc Vitals Group     BP 02/19/21 1152 (S) (!) 172/121     Pulse Rate 02/19/21 1152 (!) 108     Resp 02/19/21 1152 16     Temp 02/19/21 1152 99.2 F (37.3 C)     Temp Source 02/19/21 1152 Oral     SpO2 02/19/21 1152 100 %     Weight --      Height --      Head Circumference --      Peak Flow --      Pain Score 02/19/21 1150 7     Pain Loc --      Pain Edu? --      Excl. in [place]? --    No data found.  Updated Vital Signs BP (S) (!) 172/121 (BP Location: Left Arm) Comment: did not take BP med today.   Pulse (!) 108    Temp 99.2 F (37.3 C) (Oral)    Resp 16    SpO2 100%   Visual Acuity Right Eye Distance:   Left Eye Distance:   Bilateral Distance:    Right Eye Near:   Left Eye Near:    Bilateral Near:     Physical Exam Vitals and nursing note reviewed.  Constitutional:      General: He is not in acute distress.    Appearance: Normal appearance. He is not ill-appearing.  HENT:     Head: Normocephalic and atraumatic.     Right Ear: Tympanic membrane, ear canal and external ear normal. There is no impacted cerumen.     Left Ear: Ear canal and external ear normal. There is no impacted cerumen.     Nose: Congestion and rhinorrhea present.     Mouth/Throat:     Mouth:  Mucous membranes are moist.     Pharynx: Oropharynx is clear. No posterior oropharyngeal erythema.  Cardiovascular:     Rate and Rhythm: Normal rate and regular rhythm.     Pulses: Normal pulses.     Heart sounds: Normal heart sounds. No murmur heard.   No friction rub. No gallop.  Pulmonary:     Effort: Pulmonary effort is normal.     Breath sounds: Normal breath sounds. No wheezing, rhonchi or rales.  Abdominal:     Palpations: Abdomen is soft.     Tenderness: There is abdominal tenderness. There is no right CVA tenderness, left CVA tenderness, guarding or rebound.  Genitourinary:    Rectum: Normal.     Comments: Patient has significant tenderness with digital rectal exam.  Prostate is tender to palpation but does not feel enlarged and is smooth in texture.  No gross blood noted.  No hemorrhoids.  Normal rectal tone. Musculoskeletal:     Cervical back: Normal range of motion and neck supple.  Lymphadenopathy:     Cervical: No cervical adenopathy.  Skin:    General: Skin is warm and dry.     Capillary Refill: Capillary refill takes less than 2 seconds.     Findings: No erythema or rash.  Neurological:     General: No focal deficit present.     Mental Status: He is alert and oriented to person, place, and time.  Psychiatric:        Mood and Affect: Mood normal.        Behavior: Behavior normal.        Thought Content: Thought content normal.        Judgment: Judgment normal.     UC Treatments / Results  Labs (all labs ordered are listed, but only abnormal results are displayed) Labs Reviewed - No data to display   EKG   Radiology No results found.  Procedures Procedures (including critical care time)  Medications Ordered in UC Medications - No data to display  Initial Impression / Assessment and Plan / UC Course  I have reviewed the triage vital signs and the nursing notes.  Pertinent labs & imaging results that were available during my care of the patient were  reviewed by me and considered in my medical decision making (see chart for details).  Appearing 51 year old male here for evaluation of respiratory and GU complaints as outlined in HPI above.  On the respiratory front: Patient has an erythematous and injected left tympanic membrane with a loss of landmarks.  The external auditory canal is clear.  Right TM is pearly gray in appearance with normal light reflex and clear external auditory canal.  Nasal mucosa is erythematous and edematous with clear discharge in both nares.  Oropharyngeal exam is benign.  No cervical adenopathy appreciable exam.  Cardiopulmonary exam is clear lung sounds in all fields.  On the GU front: Patient's abdomen is protuberant with mild suprapubic tenderness to palpation.  No guarding or rebound.  Patient has no CVA tenderness on exam.  DRE performed and reveals markedly tender prostate without significant enlargement.  The prostate is smooth in texture as well.  Normal rectal tone and there is no hemorrhoids present and no gross blood.  Patient exam is consistent with an upper respiratory infection, otitis media, and prostatitis.  We will place patient on Levaquin 500 mg daily for 14 days this will cover both upper respiratory and GU complaints.  I am also can restart him on his Flomax 0.4 mg daily which he has been out of for the past 2 months.  In addition I have prescribed Atrovent nasal spray to help with nasal congestion, Tessalon Perles help with cough, and Promethazine DM cough subtype with cough and congestion at bedtime.  I have advised the patient that he needs to increase his oral  fluid intake as he states he is only had 1 Gatorade, half a bottle of water, and a couple coffee today.  I have also informed him that if he is unable to urinate after increasing his oral fluid intake and resuming his Flomax that he needs to go to the ER for evaluation as we do not have ability to place a catheter here at the urgent care.   Patient verbalized understanding.  Work note provided.   Final Clinical Impressions(s) / UC Diagnoses   Final diagnoses:  Non-recurrent acute suppurative otitis media of left ear without spontaneous rupture of tympanic membrane     Discharge Instructions      Take the levaquin  daily with food for 14 days.  Increase your oral fluid intake so as to increase urine production and help flush your urinary tract.  Prostatitis can be difficult to treat and may sometimes require a second round of antibiotics.  Please return for reevaluation, or see your primary care provider after you finish antibiotics if your symptoms have not resolved.  Return sooner for reevaluation, or go to the ER, if you develop any decrease in urine stream or inability to urinate, low back pain, flulike symptoms, fever, nausea, or vomiting.  Also denies any blood in her urine.   Use the Atrovent nasal spray, 2 squirts in each nostril every 6 hours, as needed for runny nose and postnasal drip.  Use the Tessalon Perles every 8 hours during the day.  Take them with a small sip of water.  They may give you some numbness to the base of your tongue or a metallic taste in your mouth, this is normal.  Use the Promethazine DM cough syrup at bedtime for cough and congestion.  It will make you drowsy so do not take it during the day.  Take an over-the-counter probiotic 1 hour after each dose of antibiotic to prevent diarrhea.  Use over-the-counter Tylenol and ibuprofen as needed for pain or fever.  Place a hot water bottle, or heating pad, underneath your pillowcase at night to help dilate up your ear and aid in pain relief as well as resolution of the infection.  Return for reevaluation for any new or worsening symptoms.       ED Prescriptions     Medication Sig Dispense Auth. Provider   tamsulosin (FLOMAX) 0.4 MG CAPS capsule Take 1 capsule (0.4 mg total) by mouth daily. 30 capsule Margarette Canada, NP   levofloxacin  (LEVAQUIN) 500 MG tablet Take 1 tablet (500 mg total) by mouth daily. 7 tablet Margarette Canada, NP   benzonatate (TESSALON) 100 MG capsule Take 2 capsules (200 mg total) by mouth every 8 (eight) hours. 21 capsule Margarette Canada, NP   ipratropium (ATROVENT) 0.06 % nasal spray Place 2 sprays into both nostrils 4 (four) times daily. 15 mL Margarette Canada, NP   promethazine-dextromethorphan (PROMETHAZINE-DM) 6.25-15 MG/5ML syrup Take 5 mLs by mouth 4 (four) times daily as needed. 118 mL Margarette Canada, NP      PDMP not reviewed this encounter.   Margarette Canada, NP 02/19/21 1304

## 2021-02-19 NOTE — ED Triage Notes (Signed)
Patient presents to Urgent Care with complaints of dysuria and abdominal pressure since Tuesday.  productive cough since Sunday. Last known fever Tuesday. Treating symptoms with mucienx and tylenol cold/flu.  Denies hematuria.

## 2021-05-12 ENCOUNTER — Encounter: Payer: Self-pay | Admitting: Emergency Medicine

## 2021-05-12 ENCOUNTER — Other Ambulatory Visit: Payer: Self-pay

## 2021-05-12 ENCOUNTER — Ambulatory Visit
Admission: EM | Admit: 2021-05-12 | Discharge: 2021-05-12 | Disposition: A | Discharge status: Home / Self Care | Payer: Self-pay | Attending: Physician Assistant | Admitting: Physician Assistant

## 2021-05-12 DIAGNOSIS — K029 Dental caries, unspecified: Secondary | ICD-10-CM

## 2021-05-12 DIAGNOSIS — K047 Periapical abscess without sinus: Secondary | ICD-10-CM

## 2021-05-12 DIAGNOSIS — I10 Essential (primary) hypertension: Secondary | ICD-10-CM

## 2021-05-12 MED ORDER — AMOXICILLIN-POT CLAVULANATE 875-125 MG PO TABS: 1.0000 | ORAL_TABLET | Freq: Two times a day (BID) | ORAL | 0 refills | Status: AC

## 2021-05-12 NOTE — ED Provider Notes (Signed)
?[facility] ? ? ? ?CSN: [id number] ?Arrival date & time: 05/12/21  1304 ? ? ?  ? ?History   ?Chief Complaint ?Chief Complaint  ?Patient presents with  ? Dental Pain  ? ? ?HPI ?Scott Campos is a 51 y.o. male presenting for dental pain of the right lower tooth.  Patient says he has had issues with this tooth for a couple of years and it has become infected several times.  Reports that he recently reached out to a dentist but cannot get an appointment for another week.  He says he knows he needs an antibiotic sooner.  Over the past 2 days he has had significant pain in the tooth along with swelling of his face.  Has been taking ibuprofen and using Orajel also icing his cheek which has helped somewhat.  Denies any associated fevers.  Reports increased pain when trying to eat or drink.  Of note, patient does have history of hypertension and takes lisinopril.  Patient says he has not taken it today since he did not feel well.  He admits he is inconsistent with taking the medication.  Not reporting any associated chest pain, severe headaches, vision changes, leg swelling or breathing trouble. ? ?HPI ? ?Past Medical History:  ?Diagnosis Date  ? Bipolar 1 disorder ([place])   ? Cleft palate and lip   ? [place] Patient infection   ? Hypertension   ? Right knee injury   ? ? ?Patient Active Problem List  ? Diagnosis Date Noted  ? Annual physical exam 11/22/2019  ? Chest pain of uncertain etiology [id number]  ? SOB (shortness of breath) 06/05/2018  ? COPD (chronic obstructive pulmonary disease) ([place]) 06/05/2018  ? Essential hypertension 02/15/2017  ? ? ?Past Surgical History:  ?Procedure Laterality Date  ? CLEFT PALATE REPAIR    ? ? ? ? ? ?Home Medications   ? ?Prior to Admission medications   ?Medication Sig Start Date End Date Taking? Authorizing Provider  ?amoxicillin-clavulanate (AUGMENTIN) 875-125 MG tablet Take 1 tablet by mouth every 12 (twelve) hours for 10 days. 05/12/21 05/22/21 Yes Danton Clap, PA-C   ?fexofenadine (ALLEGRA ALLERGY) 180 MG tablet Take 1 tablet (180 mg total) by mouth daily. 01/20/21  Yes Rodriguez-Southworth, Sunday Spillers, PA-C  ?fluticasone (FLONASE) 50 MCG/ACT nasal spray Place 2 sprays into both nostrils daily. 01/20/21  Yes Rodriguez-Southworth, Sunday Spillers, PA-C  ?lisinopril (ZESTRIL) 20 MG tablet Take 1 tablet (20 mg total) by mouth daily. 12/24/20  Yes Hughie Closs, PA-C  ?tamsulosin (FLOMAX) 0.4 MG CAPS capsule Take 1 capsule (0.4 mg total) by mouth daily. 02/19/21  Yes Margarette Canada, NP  ?benzonatate (TESSALON) 100 MG capsule Take 2 capsules (200 mg total) by mouth every 8 (eight) hours. 02/19/21   Margarette Canada, NP  ?hydrochlorothiazide (HYDRODIURIL) 25 MG tablet Take 1 tablet (25 mg total) by mouth daily. 12/24/20   Hughie Closs, PA-C  ?ipratropium (ATROVENT) 0.06 % nasal spray Place 2 sprays into both nostrils 4 (four) times daily. 02/19/21   Margarette Canada, NP  ?Multiple Vitamin (MULTI VITAMIN DAILY PO) Take by mouth.    [provider]  ?albuterol (PROVENTIL HFA;VENTOLIN HFA) 108 (90 Base) MCG/ACT inhaler Inhale 1-2 puffs into the lungs every 6 (six) hours as needed for wheezing or shortness of breath. ?Patient not taking: No sig reported 04/20/18 03/05/20  Mikey College, NP  ?Fluticasone-Salmeterol (ADVAIR) 100-50 MCG/DOSE AEPB Inhale 1 puff into the lungs 2 (two) times daily. ?Patient not taking: No sig reported 04/25/18  03/05/20  Mikey College, NP  ?gabapentin (NEURONTIN) 100 MG capsule TAKE 1 CAPSULE BY MOUTH 3 TIMES DAILY ?Patient not taking: No sig reported 10/10/17 03/05/20  Mikey College, NP  ? ? ?Family History ?Family History  ?Problem Relation Age of Onset  ? Leukemia Mother   ? Diabetes Father   ? Hypertension Father   ? ? ?Social History ?Social History  ? ?Tobacco Use  ? Smoking status: Every Day  ?  Packs/day: 0.25  ?  Years: 15.00  ?  Pack years: 3.75  ?  Types: Cigarettes  ? Smokeless tobacco: Former  ?Vaping Use  ? Vaping Use: Never used  ?Substance Use  Topics  ? Alcohol use: Yes  ?  Alcohol/week: 12.0 standard drinks  ?  Types: 12 Cans of beer per week  ? Drug use: No  ? ? ? ?Allergies   ?Darvon [propoxyphene] ? ? ?Review of Systems ?Review of Systems  ?Constitutional:  Negative for fatigue and fever.  ?HENT:  Positive for dental problem and facial swelling. Negative for trouble swallowing.   ?Respiratory:  Negative for shortness of breath.   ?Cardiovascular:  Negative for chest pain and leg swelling.  ?Neurological:  Negative for weakness.  ?Hematological:  Negative for adenopathy.  ? ? ?Physical Exam ?Triage Vital Signs ?ED Triage Vitals  ?Enc Vitals Group  ?   BP 05/12/21 1431 (!) 208/142  ?   Pulse Rate 05/12/21 1431 (!) 103  ?   Resp 05/12/21 1431 18  ?   Temp 05/12/21 1431 98.7 ?F (37.1 ?C)  ?   Temp Source 05/12/21 1431 Oral  ?   SpO2 05/12/21 1431 100 %  ?   Weight 05/12/21 1428 240 lb 1.3 oz (108.9 kg)  ?   Height 05/12/21 1428 5\' 7"  (1.702 m)  ?   Head Circumference --   ?   Peak Flow --   ?   Pain Score 05/12/21 1427 9  ?   Pain Loc --   ?   Pain Edu? --   ?   Excl. in [place]? --   ? ?No data found. ? ?Updated Vital Signs ?BP (!) 205/136 (BP Location: Right Arm) Comment: Pt states "thats about normal for me"  Pulse (!) 103   Temp 98.7 ?F (37.1 ?C) (Oral)   Resp 18   Ht 5\' 7"  (1.702 m)   Wt 240 lb 1.3 oz (108.9 kg)   SpO2 100%   BMI 37.60 kg/m?  ?   ? ?Physical Exam ?Vitals and nursing note reviewed.  ?Constitutional:   ?   General: He is not in acute distress. ?   Appearance: Normal appearance. He is well-developed. He is not ill-appearing.  ?HENT:  ?   Head: Normocephalic and atraumatic.  ?   Mouth/Throat:  ?   Mouth: Mucous membranes are moist.  ?   Dentition: Dental caries present.  ?   Pharynx: Oropharynx is clear.  ? ?   Comments: Mild swelling to right cheek ?Eyes:  ?   General: No scleral icterus. ?   Conjunctiva/sclera: Conjunctivae normal.  ?Cardiovascular:  ?   Rate and Rhythm: Regular rhythm. Tachycardia present.  ?   Heart sounds: Normal  heart sounds.  ?Pulmonary:  ?   Effort: Pulmonary effort is normal. No respiratory distress.  ?   Breath sounds: Normal breath sounds.  ?Musculoskeletal:  ?   Cervical back: Neck supple.  ?Skin: ?   General: Skin is warm and dry.  ?  Capillary Refill: Capillary refill takes less than 2 seconds.  ?Neurological:  ?   General: No focal deficit present.  ?   Mental Status: He is alert. Mental status is at baseline.  ?   Motor: No weakness.  ?   Coordination: Coordination normal.  ?   Gait: Gait normal.  ?Psychiatric:     ?   Mood and Affect: Mood normal.     ?   Behavior: Behavior normal.     ?   Thought Content: Thought content normal.  ? ? ? ?UC Treatments / Results  ?Labs ?(all labs ordered are listed, but only abnormal results are displayed) ?Labs Reviewed - No data to display ? ?EKG ? ? ?Radiology ?No results found. ? ?Procedures ?Procedures (including critical care time) ? ?Medications Ordered in UC ?Medications - No data to display ? ?Initial Impression / Assessment and Plan / UC Course  ?I have reviewed the triage vital signs and the nursing notes. ? ?Pertinent labs & imaging results that were available during my care of the patient were reviewed by me and considered in my medical decision making (see chart for details). ? ?51 year old male presenting for pain of tooth of the right lower jaw for the past couple of days.  Reports issues with this tooth off and on for the past 2 years.  Has a dental appointment in a week.  Has been taking ibuprofen and using Orajel for pain as well as icing his face.   ? ?Patient is presently afebrile and overall well-appearing.  Today patient's blood pressure was 205/136.  Pulse elevated at 103 bpm.  Patient reports not taking his lisinopril today.  Also admits he is inconsistent with taking it.  Says it has been a while since he had a physical.  He says he wants to look for a different primary care provider.  I did discuss with him the importance of having controlled blood  pressure and advised that he needs to take his lisinopril as directed.  Also advised that he needs to have a routine visit with his provider.  Advised if he is unsatisfied with the provider he has, he can try to get

## 2021-05-12 NOTE — ED Triage Notes (Signed)
Pt c/o right bottom tooth pain. Started a "while" ago but has gotten worse in the last 2 days. He also has swelling in his right jaw.  ?

## 2021-05-12 NOTE — Discharge Instructions (Signed)
-  Follow up with dentist at the next available appointment.  In the meantime, we will cover for dental infection with Augmentin.  Take NSAIDs/Tylenol for pain relief. Consider Orajel also. May ice the area. Follow up with dentist in the next few days. In some cases, the tooth may needed to be extracted. Follow up with [place] or ER sooner if the condition worsens before the dental appointment. If they develop a fever, significant soft tissue swelling, or worse pain, go to ER  ?-Blood pressure is very high today.  Make sure you are taking your medication.  If you are not satisfied with the primary care provider that you have, please contact Medicaid to have your PCP changed.  Need to keep blood log of your blood pressures and follow-up with primary care provider about it.  Your blood pressure is uncontrolled.  Your lisinopril dose may need to be changed or another medication added. ?

## 2021-08-06 ENCOUNTER — Ambulatory Visit: Payer: Self-pay

## 2021-08-06 NOTE — Telephone Encounter (Signed)
  Chief Complaint: chest pain Symptoms: burning in the chest and severe pain Frequency: ongoing for 1 year and gotten worse several weeks  Pertinent Negatives: NA Disposition: [x] ED /[] Urgent Care (no appt availability in office) / [] Appointment(In office/virtual)/ []  [facility]/ [] Home Care/ [] Refused Recommended Disposition /[] [place] [place] [facility]/ []  Follow-up with PCP Additional Notes: pt states he had bad episode this morning where he had to sit in the bathroom for about an hr. Pt says when sx present he unable to talk and has to get in a position where he can burp and finally things will ease up. He has tried taking Prilosec OTC and some other medication he got from a friend but nothing helps and he is really concerned about it. I advised him based on sx reported to go to ED and get a full work up done. Pt verbalized understanding.    Reason for Disposition  [1] Chest pain lasts > 5 minutes AND [2] occurred in past 3 days (72 hours) (Exception: Feels exactly the same as previously diagnosed heartburn and has accompanying sour taste in mouth.)  Answer Assessment - Initial Assessment Questions 1. LOCATION: "Where does it hurt?"       In the center of chest  3. ONSET: "When did the chest pain begin?" (Minutes, hours or days)      Mildly 1 year ago, gotten severe past couple of months 4. PATTERN: "Does the pain come and go, or has it been constant since it started?"  "Does it get worse with exertion?"      Comes and goes  6. SEVERITY: "How bad is the pain?"  (e.g., Scale 1-10; mild, moderate, or severe)    - MILD (1-3): doesn't interfere with normal activities     - MODERATE (4-7): interferes with normal activities or awakens from sleep    - SEVERE (8-10): excruciating pain, unable to do any normal activities   severe 10. OTHER SYMPTOMS: "Do you have any other symptoms?" (e.g., dizziness, nausea, vomiting, sweating, fever, difficulty breathing, cough)       Coughing,  spitting up clear fluids, belching  Protocols used: Chest Pain-A-AH

## 2021-09-25 ENCOUNTER — Ambulatory Visit
Admission: EM | Admit: 2021-09-25 | Discharge: 2021-09-25 | Disposition: A | Discharge status: Home / Self Care | Payer: Self-pay | Attending: Emergency Medicine | Admitting: Emergency Medicine

## 2021-09-25 DIAGNOSIS — I10 Essential (primary) hypertension: Secondary | ICD-10-CM

## 2021-09-25 DIAGNOSIS — K2100 Gastro-esophageal reflux disease with esophagitis, without bleeding: Secondary | ICD-10-CM

## 2021-09-25 MED ORDER — SUCRALFATE 1 G PO TABS: 1.0000 g | ORAL_TABLET | Freq: Three times a day (TID) | ORAL | 1 refills | Status: AC

## 2021-09-25 MED ORDER — OMEPRAZOLE 20 MG PO CPDR: 20.0000 mg | DELAYED_RELEASE_CAPSULE | Freq: Two times a day (BID) | ORAL | 1 refills | Status: AC

## 2021-09-25 NOTE — ED Triage Notes (Signed)
Pt c/o acid reflux, pt states he is out of his meds x1 week, pt states he usually takes Carafate, Prilosec for his GERD

## 2021-09-25 NOTE — ED Provider Notes (Signed)
[facility]    CSN: [id number] Arrival date & time: 09/25/21  1326      History   Chief Complaint Chief Complaint  Patient presents with   Medication Refill    HPI Scott Campos is a 51 y.o. male.   HPI  51 year old male here requesting medication refill.  Patient reports that he suffers from GERD and that he has been taking Prilosec and Carafate but he ran out a week ago.  This is caused an increase in his reflux symptoms to include burning in his esophagus that is worse when he lays down.  He is having to sleep in a chair.  He states that he has decreased his spicy food intake and has been eating more bland foods and fruits.  He denies any citric acid consumption.  He does have a PCP at [facility] but he is not had a chance to make an appointment to get into see them for referral to GI.  He has not seen GI in the past.  He denies any chest pain or shortness of breath.  He does have an elevated blood pressure at 168/122 here in clinic.  No headache, dizziness, or shortness of breath.  He is prescribed hydrochlorothiazide and lisinopril and states that he did not take his blood pressure medication this morning.  With his reflux issues his sleep schedule has been off and he forgot to take it this morning.  Past Medical History:  Diagnosis Date   Bipolar 1 disorder ([facility])    Cleft palate and lip    Epstein Barr infection    Hypertension    Right knee injury     Patient Active Problem List   Diagnosis Date Noted   Annual physical exam 11/22/2019   Chest pain of uncertain etiology 06/06/2018   SOB (shortness of breath) 06/05/2018   COPD (chronic obstructive pulmonary disease) ([facility]) 06/05/2018   Essential hypertension 02/15/2017    Past Surgical History:  Procedure Laterality Date   CLEFT PALATE REPAIR         Home Medications    Prior to Admission medications   Medication Sig Start Date End Date Taking? Authorizing Provider   fexofenadine (ALLEGRA ALLERGY) 180 MG tablet Take 1 tablet (180 mg total) by mouth daily. 01/20/21  Yes Rodriguez-Southworth, Nettie Elm, PA-C  fluticasone (FLONASE) 50 MCG/ACT nasal spray Place 2 sprays into both nostrils daily. 01/20/21  Yes Rodriguez-Southworth, Nettie Elm, PA-C  hydrochlorothiazide (HYDRODIURIL) 25 MG tablet Take 1 tablet (25 mg total) by mouth daily. 12/24/20  Yes CovingtonMaralyn Sago M, PA-C  ipratropium (ATROVENT) 0.06 % nasal spray Place 2 sprays into both nostrils 4 (four) times daily. 02/19/21  Yes Becky Augusta, NP  lisinopril (ZESTRIL) 20 MG tablet Take 1 tablet (20 mg total) by mouth daily. 12/24/20  Yes Covington, Sarah M, PA-C  Multiple Vitamin (MULTI VITAMIN DAILY PO) Take by mouth.   Yes [provider]  omeprazole (PRILOSEC) 20 MG capsule Take 1 capsule (20 mg total) by mouth 2 (two) times daily before a meal. 09/25/21 10/25/21 Yes Becky Augusta, NP  sucralfate (CARAFATE) 1 g tablet Take 1 tablet (1 g total) by mouth 4 (four) times daily -  with meals and at bedtime. 09/25/21 10/25/21 Yes Becky Augusta, NP  tamsulosin (FLOMAX) 0.4 MG CAPS capsule Take 1 capsule (0.4 mg total) by mouth daily. 02/19/21  Yes Becky Augusta, NP  benzonatate (TESSALON) 100 MG capsule Take 2 capsules (200 mg total) by mouth every 8 (eight)  hours. 02/19/21   Becky Augusta, NP  albuterol (PROVENTIL HFA;VENTOLIN HFA) 108 (90 Base) MCG/ACT inhaler Inhale 1-2 puffs into the lungs every 6 (six) hours as needed for wheezing or shortness of breath. Patient not taking: No sig reported 04/20/18 03/05/20  Galen Manila, NP  Fluticasone-Salmeterol (ADVAIR) 100-50 MCG/DOSE AEPB Inhale 1 puff into the lungs 2 (two) times daily. Patient not taking: No sig reported 04/25/18 03/05/20  Galen Manila, NP  gabapentin (NEURONTIN) 100 MG capsule TAKE 1 CAPSULE BY MOUTH 3 TIMES DAILY Patient not taking: No sig reported 10/10/17 03/05/20  Galen Manila, NP    Family History Family History  Problem Relation Age of  Onset   Leukemia Mother    Diabetes Father    Hypertension Father     Social History Social History   Tobacco Use   Smoking status: Every Day    Packs/day: 0.25    Years: 15.00    Total pack years: 3.75    Types: Cigarettes   Smokeless tobacco: Former  Building services engineer Use: Never used  Substance Use Topics   Alcohol use: Yes    Alcohol/week: 12.0 standard drinks of alcohol    Types: 12 Cans of beer per week   Drug use: No     Allergies   Darvon [propoxyphene]   Review of Systems Review of Systems  Respiratory:  Negative for shortness of breath.   Cardiovascular:  Negative for chest pain.  Gastrointestinal:        Burning in the esophagus.  Neurological:  Negative for dizziness.     Physical Exam Triage Vital Signs ED Triage Vitals  Enc Vitals Group     BP 09/25/21 1345 (S) (!) 168/122     Pulse Rate 09/25/21 1341 (!) 104     Resp --      Temp 09/25/21 1341 98 F (36.7 C)     Temp Source 09/25/21 1341 Oral     SpO2 09/25/21 1341 100 %     Weight 09/25/21 1340 240 lb (108.9 kg)     Height 09/25/21 1340 5\' 7"  (1.702 m)     Head Circumference --      Peak Flow --      Pain Score 09/25/21 1340 7     Pain Loc --      Pain Edu? --      Excl. in [place]? --    No data found.  Updated Vital Signs BP (S) (!) 168/122 (BP Location: Left Arm)   Pulse (!) 104   Temp 98 F (36.7 C) (Oral)   Ht 5\' 7"  (1.702 m)   Wt 240 lb (108.9 kg)   SpO2 100%   BMI 37.59 kg/m   Visual Acuity Right Eye Distance:   Left Eye Distance:   Bilateral Distance:    Right Eye Near:   Left Eye Near:    Bilateral Near:     Physical Exam Vitals and nursing note reviewed.  Constitutional:      Appearance: Normal appearance. He is obese. He is not ill-appearing.  HENT:     Head: Normocephalic and atraumatic.  Cardiovascular:     Rate and Rhythm: Normal rate.     Pulses: Normal pulses.     Heart sounds: Normal heart sounds. No murmur heard.    No friction rub. No gallop.   Pulmonary:     Effort: Pulmonary effort is normal.     Breath sounds: Normal breath sounds. No wheezing, rhonchi  or rales.  Abdominal:     Palpations: Abdomen is soft.     Tenderness: There is no abdominal tenderness.  Skin:    General: Skin is warm and dry.     Capillary Refill: Capillary refill takes less than 2 seconds.     Findings: No erythema or rash.  Neurological:     General: No focal deficit present.     Mental Status: He is alert and oriented to person, place, and time.  Psychiatric:        Mood and Affect: Mood normal.        Behavior: Behavior normal.        Thought Content: Thought content normal.        Judgment: Judgment normal.      UC Treatments / Results  Labs (all labs ordered are listed, but only abnormal results are displayed) Labs Reviewed - No data to display  EKG   Radiology No results found.  Procedures Procedures (including critical care time)  Medications Ordered in UC Medications - No data to display  Initial Impression / Assessment and Plan / UC Course  I have reviewed the triage vital signs and the nursing notes.  Pertinent labs & imaging results that were available during my care of the patient were reviewed by me and considered in my medical decision making (see chart for details).   Patient is a nontoxic-appearing 51 year old male with a history of GERD and hypertension here requesting a refill of his Prilosec and Carafate for treatment of his GERD symptoms.  He states he has been out for a week.  His blood pressure was quite elevated at triage at 168/122 but he is denying any headache, dizziness, chest pain, or shortness of breath.  He does have burning in his esophagus which he states is consistent with normal reflux symptoms.  He reports that for the last week he has been having to sleep sitting in a recliner as laying flat includes his symptoms.  He has made dietary modifications to include elimination of spicy foods and foods that  are high in citric acid.  He states that he was well controlled on the Prilosec 40 mg daily and Carafate.  He does have a PCP at [facility] but he has not seen them for this issue and he has not seen GI in the past.  I have encouraged him to make an appointment to see [facility] and get a referral to GI as he is going to need a endoscopy to look at the condition of his esophagus.  He denies any hemoptysis or bleeding.  His physical exam reveals S1-S2 heart sounds with regular rate and rhythm and lung sounds adequate auscultation all fields.  Patient has no epigastric abdominal pain and no abdominal fullness.  I will refill his Prilosec and have him take 20 mg twice daily.  I will also refill his Carafate and he can take 1 g tablet before meals and at bedtime as needed.  I have also reminded the patient that he needs to take his blood pressure medication regularly.  If he develops any chest pain, shortness of breath, headache, or dizziness he needs to go to the ER for evaluation.  Patient verbalizes understanding of same.   Final Clinical Impressions(s) / UC Diagnoses   Final diagnoses:  Gastroesophageal reflux disease with esophagitis without hemorrhage  Primary hypertension     Discharge Instructions      Take the Prilosec twice daily for  control of your reflux.  You can also take Carafate before meals and at bedtime.  Schedule an appointment with [facility] for future management and also for a referral to GI for evaluation.  Take your blood pressure medication as prescribed.   If you develop any chest pain, dizziness, headache, or shortness of breath you need to go to the ER.      ED Prescriptions     Medication Sig Dispense Auth. Provider   omeprazole (PRILOSEC) 20 MG capsule Take 1 capsule (20 mg total) by mouth 2 (two) times daily before a meal. 60 capsule Becky Augusta, NP   sucralfate (CARAFATE) 1 g tablet Take 1 tablet (1 g total) by mouth 4 (four)  times daily -  with meals and at bedtime. 120 tablet Becky Augusta, NP      PDMP not reviewed this encounter.   Becky Augusta, NP 09/25/21 1409

## 2021-09-25 NOTE — Discharge Instructions (Addendum)
Take the Prilosec twice daily for control of your reflux.  You can also take Carafate before meals and at bedtime.  Schedule an appointment with [facility] for future management and also for a referral to GI for evaluation.  Take your blood pressure medication as prescribed.   If you develop any chest pain, dizziness, headache, or shortness of breath you need to go to the ER.

## 2021-10-01 ENCOUNTER — Ambulatory Visit: Payer: Medicaid Other | Admitting: Internal Medicine

## 2021-10-01 NOTE — Progress Notes (Deleted)
Subjective:    Patient ID: Scott Campos, male    DOB: 1970/05/08, 51 y.o.   MRN: [id number]  HPI  Patient presents to clinic today for follow-up of chronic conditions.  GERD: Recent urgent care visit related to the same as he ran out of his Omeprazole and Carafate.  There is no upper GI on file.  COPD: He denies chronic cough or shortness of breath.  He is not using any inhalers at this time.  There are no PFTs on file.  HTN: His BP today is.  He is not taking any antihypertensive medication at this time.  ECG from 11/2006 reviewed.  Review of Systems     Past Medical History:  Diagnosis Date   Bipolar 1 disorder (HCC)    Cleft palate and lip    Epstein Barr infection    Hypertension    Right knee injury     Current Outpatient Medications  Medication Sig Dispense Refill   benzonatate (TESSALON) 100 MG capsule Take 2 capsules (200 mg total) by mouth every 8 (eight) hours. 21 capsule 0   fexofenadine (ALLEGRA ALLERGY) 180 MG tablet Take 1 tablet (180 mg total) by mouth daily. 14 tablet 0   fluticasone (FLONASE) 50 MCG/ACT nasal spray Place 2 sprays into both nostrils daily. 18.2 g 0   hydrochlorothiazide (HYDRODIURIL) 25 MG tablet Take 1 tablet (25 mg total) by mouth daily. 90 tablet 0   ipratropium (ATROVENT) 0.06 % nasal spray Place 2 sprays into both nostrils 4 (four) times daily. 15 mL 12   lisinopril (ZESTRIL) 20 MG tablet Take 1 tablet (20 mg total) by mouth daily. 90 tablet 0   Multiple Vitamin (MULTI VITAMIN DAILY PO) Take by mouth.     omeprazole (PRILOSEC) 20 MG capsule Take 1 capsule (20 mg total) by mouth 2 (two) times daily before a meal. 60 capsule 1   sucralfate (CARAFATE) 1 g tablet Take 1 tablet (1 g total) by mouth 4 (four) times daily -  with meals and at bedtime. 120 tablet 1   tamsulosin (FLOMAX) 0.4 MG CAPS capsule Take 1 capsule (0.4 mg total) by mouth daily. 30 capsule 2   Current Facility-Administered Medications  Medication Dose Route  Frequency Provider Last Rate Last Admin   ipratropium-albuterol (DUONEB) 0.5-2.5 (3) MG/3ML nebulizer solution 3 mL  3 mL Nebulization Q6H Galen ManilaKennedy, Lauren Renee, NP        Allergies  Allergen Reactions   Darvon [Propoxyphene] Other (See Comments)    Not sure if chest pain or diarrhea when he took it    Family History  Problem Relation Age of Onset   Leukemia Mother    Diabetes Father    Hypertension Father     Social History   Socioeconomic History   Marital status: Divorced    Spouse name: Not on file   Number of children: 0   Years of education: Not on file   Highest education level: Associate degree: occupational, Scientist, product/process developmenttechnical, or vocational program  Occupational History   Occupation: electrician  Tobacco Use   Smoking status: Every Day    Packs/day: 0.25    Years: 15.00    Total pack years: 3.75    Types: Cigarettes   Smokeless tobacco: Former  Building services engineerVaping Use   Vaping Use: Never used  Substance and Sexual Activity   Alcohol use: Yes    Alcohol/week: 12.0 standard drinks of alcohol    Types: 12 Cans of beer per week   Drug use: No  Sexual activity: Not Currently  Other Topics Concern   Not on file  Social History Narrative   Not on file   Social Determinants of Health   Financial Resource Strain: Not on file  Food Insecurity: Not on file  Transportation Needs: Unmet Transportation Needs (02/15/2017)   PRAPARE - Administrator, Civil Service (Medical): Yes    Lack of Transportation (Non-Medical): No  Physical Activity: Not on file  Stress: Not on file  Social Connections: Not on file  Intimate Partner Violence: Not on file     Constitutional: Denies fever, malaise, fatigue, headache or abrupt weight changes.  HEENT: Denies eye pain, eye redness, ear pain, ringing in the ears, wax buildup, runny nose, nasal congestion, bloody nose, or sore throat. Respiratory: Denies difficulty breathing, shortness of breath, cough or sputum production.    Cardiovascular: Denies chest pain, chest tightness, palpitations or swelling in the hands or feet.  Gastrointestinal: Denies abdominal pain, bloating, constipation, diarrhea or blood in the stool.  GU: Denies urgency, frequency, pain with urination, burning sensation, blood in urine, odor or discharge. Musculoskeletal: Denies decrease in range of motion, difficulty with gait, muscle pain or joint pain and swelling.  Skin: Denies redness, rashes, lesions or ulcercations.  Neurological: Denies dizziness, difficulty with memory, difficulty with speech or problems with balance and coordination.  Psych: Denies anxiety, depression, SI/HI.  No other specific complaints in a complete review of systems (except as listed in HPI above).  Objective:   Physical Exam   There were no vitals taken for this visit. Wt Readings from Last 3 Encounters:  09/25/21 240 lb (108.9 kg)  05/12/21 240 lb 1.3 oz (108.9 kg)  01/20/21 240 lb (108.9 kg)    General: Appears their stated age, well developed, well nourished in NAD. Skin: Warm, dry and intact. No rashes, lesions or ulcerations noted. HEENT: Head: normal shape and size; Eyes: sclera white, no icterus, conjunctiva pink, PERRLA and EOMs intact; Ears: Tm's gray and intact, normal light reflex; Nose: mucosa pink and moist, septum midline; Throat/Mouth: Teeth present, mucosa pink and moist, no exudate, lesions or ulcerations noted.  Neck:  Neck supple, trachea midline. No masses, lumps or thyromegaly present.  Cardiovascular: Normal rate and rhythm. S1,S2 noted.  No murmur, rubs or gallops noted. No JVD or BLE edema. No carotid bruits noted. Pulmonary/Chest: Normal effort and positive vesicular breath sounds. No respiratory distress. No wheezes, rales or ronchi noted.  Abdomen: Soft and nontender. Normal bowel sounds. No distention or masses noted. Liver, spleen and kidneys non palpable. Musculoskeletal: Normal range of motion. No signs of joint swelling. No  difficulty with gait.  Neurological: Alert and oriented. Cranial nerves II-XII grossly intact. Coordination normal.  Psychiatric: Mood and affect normal. Behavior is normal. Judgment and thought content normal.   BMET    Component Value Date/Time   NA 135 03/19/2020 1014   K 4.0 03/19/2020 1014   CL 101 03/19/2020 1014   CO2 23 03/19/2020 1014   GLUCOSE 143 (H) 03/19/2020 1014   BUN 11 03/19/2020 1014   CREATININE 0.71 03/19/2020 1014   CREATININE 0.88 03/18/2017 1142   CALCIUM 9.7 03/19/2020 1014   GFRNONAA >60 03/19/2020 1014   GFRNONAA 102 03/18/2017 1142   GFRAA 118 03/18/2017 1142    Lipid Panel     Component Value Date/Time   CHOL 166 03/18/2017 1142   TRIG 135 03/18/2017 1142   HDL 45 03/18/2017 1142   CHOLHDL 3.7 03/18/2017 1142   LDLCALC  98 03/18/2017 1142    CBC No results found for: "WBC", "RBC", "HGB", "HCT", "PLT", "MCV", "MCH", "MCHC", "RDW", "LYMPHSABS", "MONOABS", "EOSABS", "BASOSABS"  Hgb A1C No results found for: "HGBA1C"         Assessment & Plan:     RTC in 6 months for annual exam Nicki Reaper, NP

## 2021-10-18 DEATH — deceased
